# Patient Record
Sex: Female | Born: 1945
Health system: Southern US, Community
[De-identification: ages and names within clinical notes are randomized; demographics above are authoritative.]

## PROBLEM LIST (undated history)

## (undated) DIAGNOSIS — R350 Frequency of micturition: Secondary | ICD-10-CM

## (undated) DIAGNOSIS — R351 Nocturia: Secondary | ICD-10-CM

## (undated) DIAGNOSIS — N8110 Cystocele, unspecified: Secondary | ICD-10-CM

## (undated) DIAGNOSIS — I10 Essential (primary) hypertension: Secondary | ICD-10-CM

## (undated) DIAGNOSIS — R9431 Abnormal electrocardiogram [ECG] [EKG]: Secondary | ICD-10-CM

## (undated) DIAGNOSIS — Z87440 Personal history of urinary (tract) infections: Secondary | ICD-10-CM

## (undated) DIAGNOSIS — Z87898 Personal history of other specified conditions: Secondary | ICD-10-CM

## (undated) DIAGNOSIS — R7303 Prediabetes: Secondary | ICD-10-CM

## (undated) HISTORY — PX: TUBAL LIGATION: SHX77

## (undated) HISTORY — PX: ABDOMINAL HYSTERECTOMY: SHX81

---

## 2000-03-24 ENCOUNTER — Other Ambulatory Visit: Admission: RE | Admit: 2000-03-24 | Discharge: 2000-03-24 | Payer: Self-pay | Admitting: Family Medicine

## 2000-06-11 ENCOUNTER — Ambulatory Visit (HOSPITAL_COMMUNITY): Admission: RE | Admit: 2000-06-11 | Discharge: 2000-06-11 | Payer: Self-pay | Admitting: Gastroenterology

## 2000-06-11 ENCOUNTER — Encounter (INDEPENDENT_AMBULATORY_CARE_PROVIDER_SITE_OTHER): Payer: Self-pay | Admitting: Specialist

## 2001-05-23 ENCOUNTER — Encounter: Admission: RE | Admit: 2001-05-23 | Discharge: 2001-08-21 | Payer: Self-pay | Admitting: Family Medicine

## 2001-07-29 ENCOUNTER — Observation Stay (HOSPITAL_COMMUNITY): Admission: EM | Admit: 2001-07-29 | Discharge: 2001-07-30 | Payer: Self-pay | Admitting: Emergency Medicine

## 2001-07-29 ENCOUNTER — Encounter: Payer: Self-pay | Admitting: Emergency Medicine

## 2003-09-24 ENCOUNTER — Ambulatory Visit (HOSPITAL_COMMUNITY): Admission: RE | Admit: 2003-09-24 | Discharge: 2003-09-24 | Payer: Self-pay | Admitting: Family Medicine

## 2004-10-10 ENCOUNTER — Encounter (INDEPENDENT_AMBULATORY_CARE_PROVIDER_SITE_OTHER): Payer: Self-pay | Admitting: *Deleted

## 2004-10-10 ENCOUNTER — Ambulatory Visit (HOSPITAL_COMMUNITY): Admission: RE | Admit: 2004-10-10 | Discharge: 2004-10-10 | Payer: Self-pay | Admitting: Gastroenterology

## 2004-10-17 ENCOUNTER — Encounter: Admission: RE | Admit: 2004-10-17 | Discharge: 2004-10-17 | Payer: Self-pay | Admitting: Gastroenterology

## 2004-10-21 ENCOUNTER — Encounter: Admission: RE | Admit: 2004-10-21 | Discharge: 2004-10-21 | Payer: Self-pay | Admitting: Gastroenterology

## 2005-01-15 ENCOUNTER — Encounter: Admission: RE | Admit: 2005-01-15 | Discharge: 2005-01-15 | Payer: Self-pay | Admitting: Gastroenterology

## 2005-07-17 ENCOUNTER — Ambulatory Visit (HOSPITAL_COMMUNITY): Admission: RE | Admit: 2005-07-17 | Discharge: 2005-07-17 | Payer: Self-pay | Admitting: Cardiology

## 2005-07-17 ENCOUNTER — Encounter: Admission: RE | Admit: 2005-07-17 | Discharge: 2005-07-17 | Payer: Self-pay | Admitting: Cardiology

## 2006-09-01 ENCOUNTER — Encounter: Admission: RE | Admit: 2006-09-01 | Discharge: 2006-09-01 | Payer: Self-pay | Admitting: Family Medicine

## 2006-09-05 ENCOUNTER — Encounter: Admission: RE | Admit: 2006-09-05 | Discharge: 2006-09-05 | Payer: Self-pay | Admitting: Pediatrics

## 2006-09-20 ENCOUNTER — Ambulatory Visit (HOSPITAL_COMMUNITY): Admission: RE | Admit: 2006-09-20 | Discharge: 2006-09-20 | Payer: Self-pay | Admitting: Cardiology

## 2006-11-01 ENCOUNTER — Encounter: Admission: RE | Admit: 2006-11-01 | Discharge: 2006-11-01 | Payer: Self-pay | Admitting: Otolaryngology

## 2006-12-05 ENCOUNTER — Emergency Department (HOSPITAL_COMMUNITY): Admission: EM | Admit: 2006-12-05 | Discharge: 2006-12-05 | Payer: Self-pay | Admitting: Family Medicine

## 2007-12-13 ENCOUNTER — Encounter: Admission: RE | Admit: 2007-12-13 | Discharge: 2007-12-13 | Payer: Self-pay | Admitting: Family Medicine

## 2008-06-27 ENCOUNTER — Encounter: Admission: RE | Admit: 2008-06-27 | Discharge: 2008-06-27 | Payer: Self-pay | Admitting: Family Medicine

## 2009-07-02 ENCOUNTER — Emergency Department (HOSPITAL_COMMUNITY): Admission: EM | Admit: 2009-07-02 | Discharge: 2009-07-03 | Payer: Self-pay | Admitting: Emergency Medicine

## 2009-09-16 ENCOUNTER — Encounter: Admission: RE | Admit: 2009-09-16 | Discharge: 2009-10-01 | Payer: Self-pay | Admitting: Otolaryngology

## 2010-10-26 ENCOUNTER — Encounter: Payer: Self-pay | Admitting: Family Medicine

## 2010-10-26 ENCOUNTER — Encounter: Payer: Self-pay | Admitting: Otolaryngology

## 2011-02-20 NOTE — Op Note (Signed)
Rebekah Gibson, Rebekah NO.:  [id number]   MEDICAL RECORD NO.:  [id number]          PATIENT TYPE:  AMB   LOCATION:  ENDO                         FACILITY:  [facility]   PHYSICIAN:  Petra Kuba, M.D.    DATE OF BIRTH:  1946/07/05   DATE OF PROCEDURE:  10/10/2004  DATE OF DISCHARGE:                                 OPERATIVE REPORT   PROCEDURE:  Esophagogastroduodenoscopy.   INDICATIONS:  Upper tract symptoms, episodic dysphagia.  Consent was signed  after risks, benefits, methods, and options thoroughly discussed in the  office multiple times.   Additional medicines for this procedure:  Demerol 10 mg, Versed 1 mg.   PROCEDURE:  The video endoscope was inserted by direct vision.  The  esophagus was normal.  In the distal esophagus was a small hiatal hernia  with a widely patent, very thin ring.  The scope passed easily into the  stomach, advanced through a normal antrum, normal pylorus, into a normal  duodenal bulb and advanced around the C-loop to a normal second portion of  the duodenum.  The scope was withdrawn back to the bulb, and a good look  there ruled out ulcers in that location.  The scope was withdrawn back to  the stomach and retroflexed.  Angularis, cardia, fundus, lesser and greater  curve were normal on retroflexed visualization except for the hiatal hernia  being __________ in the cardia.  Straight visualization in the stomach did  not reveal any additional findings.  Air was suctioned and the scope slowly  withdrawn.  Again a good look at the esophagus was normal except for the  small hiatal hernia and ring as mentioned above.  The scope was removed.  The patient tolerated the procedure well.  There was no obvious immediate  complication.   ENDOSCOPIC DIAGNOSES:  1.  Small hiatal hernia with widely patent, thin ring.  2.  Otherwise normal EGD.   PLAN:  1.  Empiric dilatation p.r.n.  2.  Continue pump inhibitors.  3.  Await ultrasound.  4.   Pending those results, follow up p.r.n.  5.  Surgical consult or follow up with me in two months.       MEM/MEDQ  D:  10/10/2004  T:  10/10/2004  Job:  [id number]   cc:   Burnell Blanks, M.D.

## 2011-02-20 NOTE — Op Note (Signed)
Rebekah Gibson, Rebekah Gibson NO.:  [id number]   MEDICAL RECORD NO.:  [id number]          PATIENT TYPE:  AMB   LOCATION:  ENDO                         FACILITY:  [facility]   PHYSICIAN:  Petra Kuba, M.D.    DATE OF BIRTH:  March 15, 1946   DATE OF PROCEDURE:  10/10/2004  DATE OF DISCHARGE:                                 OPERATIVE REPORT   PROCEDURE:  Colonoscopy with polypectomy.   INDICATIONS:  History of colon polyps, due for repeat screening.  Consent  was signed after risks, benefits, methods, and options thoroughly discussed  in the office.   MEDICINES USED:  Demerol 70 mg, Versed 7 mg.   PROCEDURE:  Rectal inspection was pertinent for external hemorrhoids, small.  Digital exam was negative.  The video pediatric adjustable colonoscope was  inserted.  On insertion, left-sided diverticula were seen.  Also in the  midtransverse, a small polyp was seen and was snared on insertion.  The  polyp was suctioned through the scope and collected in the trap.  To advance  to the cecum was more difficult than in the past, possibly due to the  pediatric adjustable scope, but with abdominal pressure and rolling her on  her back, we were able to advance to the cecum.  The right colon seemed to  be extra tortuous.  Despite readvancing and withdrawing, we had difficulty  getting a good look at the right colon.  The cecum was identified by the  appendiceal orifice and the ileocecal valve.  There was a small polyp in the  cecum, which was snared on a cautery setting of 150/15.  This polyp was  suctioned through the scope and collected in the trap and put in a separate  container.  The scope was slowly withdrawn.  We did spend a good bit of time  trying to better evaluate the right side but due to looping, tortuosity, and  even rolling her on her right side and then back to her left side to try to  get better visualization, we were unsuccessful.  We just had trouble keeping  the scope in  the right side of the colon.  It seemed to fall back quickly  into the transverse.  We elected to withdraw.  The prep was adequate.  There  was some liquid stool that required washing and suctioning.  On slow  withdrawal back to the midtransverse, the polypectomy site was seen and had  a nice white coagulum.  No obvious residual polypoid tissue was seen, and  the scope was slowly withdrawn.  Other than the left-sided diverticula, no  additional findings were seen as we withdrew back to the rectum.  Anorectal  pull-through and retroflexion confirmed some small hemorrhoids.  The scope  was straightened and readvanced a short way up the left side of the colon,  air was suctioned, the scope removed.  The patient tolerated the procedure  well.  There was no obvious immediate complication.   ENDOSCOPIC DIAGNOSES:  1.  Internal-external hemorrhoids.  2.  Left-sided diverticula.  3.  Increased looping.  4.  Two small polyps, one snared in the  transverse, one in the cecum.  5.  Otherwise within normal limits to the cecum, right side not well-seen      due to tortuosity and looping.   PLAN:  Await pathology, but probably recheck colon screening in three years  using the regular scope, since we did not get a great look at the right  side.  Otherwise continue workup with a one-time EGD.       MEM/MEDQ  D:  10/10/2004  T:  10/10/2004  Job:  [id number]   cc:   Burnell Blanks, M.D.

## 2011-02-20 NOTE — Op Note (Signed)
[place]. [facility]  Patient:    Rebekah Gibson, Rebekah Gibson                 MRN: [id number] Proc. Date: 06/11/00 Adm. Date:  [id number] Disc. Date: [id number] Attending:  Nelda Marseille CC:         Burnell Blanks, M.D.   Operative Report  PROCEDURE:  Colonoscopy with polypectomy.  ENDOSCOPIST:  Petra Kuba, M.D.  INDICATIONS:  Screening, gas, bloat.  INFORMED CONSENT:  Consent was signed after risk, benefits, methods and options were thoroughly discussed in the office.  MEDICINES USED:  Demerol 75 mg, Versed 7 mg.  DESCRIPTION OF PROCEDURE:  Rectal inspection is pertinent for external hemorrhoids.  Digital exam was negative.  Video colonoscope was inserted fairly easily and advanced around the colon to the cecum.  This did require abdominal pressure rolling her on her back.  The cecum was identified by the appendiceal orifice and the ileocecal valve.  On insertion some left-sided diverticula as well as small polyp in the proximal level of the hepatic flexure.  No other abnormalities were seen on insertion.  The scope was slowly withdrawn.  The prep was adequate.  There was some liquid stool that required washing and suctioning.  Cecum and ascending were normal.  Scope was withdrawn back to the small polyp in the transverse which was snared, electrocautery applied and the polyp was suctioned through the scope and collected in the trap.  The scope was further withdrawn and in the mid transverse another tiny polyp was seen and was hot biopsied x 1.  Scope was further withdrawn to the proximal level of the splenic flexure where another 2-3 mm polyp was seen, snared, unfortunately we cut through the polyp and went ahead hot biopsied at the base.  The residual tissue was suctioned through the scope and collected in the trap.  The scope was further withdrawn.  Some rare occasional left-sided diverticula were seen.  Two tiny sigmoid polyps were seen and  were each hot biopsied x 1.  No other abnormalities were seen as we slowly withdrew back to the rectum.  Once back in the rectum the scope was retroflexed pertinent for some internal hemorrhoids.  Scope was straightened and readvanced a short ways up the sigmoid.  Air was suctioned, scope removed. The patient tolerated the procedure well.  There was no obvious immediate complication.  ENDOSCOPIC DIAGNOSIS: 1. Internal/external hemorrhoids. 2. Rare early left-sided diverticula. 3. ______ small polyps - 2 snared and 3 hot biopsied with one in the snares    requiring hot biopsy of the base. 4. Otherwise within normal limits to the cecum. DD:  06/11/00 TD:  06/13/00 Job: [id number]

## 2011-02-20 NOTE — H&P (Signed)
[place]. [facility]  Patient:    Rebekah Gibson, Rebekah Gibson Visit Number: [id number] MRN: [id number]          Service Type: MED Location: [phone] Attending Physician:  McDiarmid, Leighton Roach. Dictated by:   Solon Palm, M.D. Admit Date:  07/29/2001 Discharge Date: 07/30/2001   CC:         Burnell Blanks, M.D.   History and Physical  SERVICE:  Conservation officer, historic buildings.  ATTENDING:  Huey Bienenstock McDiarmid, M.D.  PRIMARY:  Burnell Blanks, M.D. at [facility].  CHIEF COMPLAINT:  Chest pain.  HISTORY OF PRESENT ILLNESS:  Patient is a 65 year old white female with history of hyperlipidemia and glucose intolerance who works in the ED and noticed an increased intensity and duration of chest discomfort that she has noticed intermittently over this past week.  She describes the discomfort as a dull ache at her left sternal border at the third and fourth intercostal sternal junction, onset about 10 a.m., now improved with rest.  Patient refused a nitroglycerin offered.  Denies associated, nausea, or diaphoresis. It radiates to the left lateral chest wall area at the neck or arm.  It is unaffected by her movement or by position.  PAST MEDICAL HISTORY: 1. Hypercholesterolemia, hypertriglyceridemia - attempting control with diet    measures and garlic. 2. Glucose resistance - blood sugar diet controlled. 3. History of chest pain, admitted to [facility] in 1989 when she had a    catheterization, told it was normal without blockage. 4. Hysterectomy in 1995 secondary to uterine fibroids.  FAMILY HISTORY:  Mother died at 52 years secondary to cardiac disease and she had a CABG in her 61s.  Her first MI was in her 73s.  Father died in 60s of cancer.  Healthy children and siblings.  No diabetes, cancer, hypertension.  MEDICATIONS:  None regularly.  Does take garlic, multivitamins, beta complex, lecithin sometimes, as well as over-the-counter  ibuprofen.  ALLERGIES:  CODEINE causes her to itch.  ERYTHROMYCIN causes GI upset.  She did have hypotension after being given NITROGLYCERIN at [facility].  SOCIAL HISTORY:  She is divorced after a 23-year marriage.  She has been remarried for two years in a good relationship.  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  No tobacco or alcohol, no drugs.  REVIEW OF SYSTEMS:  GENERAL:  No recent URI illness, weight change, fever, or chills.  ENT:  No allergies.  EYES:  Occasional blurred vision with increased blood sugar.  CV:  As above.  No orthopnea.  No paroxysmal nocturnal dyspnea. No exertional chest pain.  PULMONARY:  No dyspnea, cough, wheeze.  GI:  Over past years, had six to seven episodes of food "getting stuck;" having to relax for her improved discomfort.  No recent episodes, no dyspepsia, no reflux, no pain, no melena, no hematochezia.  Normal bowel movements daily. MUSCULOSKELETAL:  Right neck strain from holding telephone at work.  Bilateral knee pain, worse with weather, relieved by ibuprofen.  PHYSICAL EXAMINATION:  VITAL SIGNS:  Blood pressure 157/94, heart rate 73, respirations 18, O2 saturation 100% 2 liters O2, temperature 98.  GENERAL:  Calm, no acute distress, and pleasant.  HEENT:  Normocephalic, atraumatic.  PERRLA, EOMI.  Nares patent.  Oropharynx normal.  NECK:  No thyromegaly, no lymphadenopathy.  CHEST:  Good air movement.  No wheezes, rales, or rhonchi.  CARDIOVASCULAR:  No JVD, no bruit.  Regular rate and rhythm.  A 2/6 systolic ejection  murmur greater left sternal border.  No chest wall tenderness.  ABDOMEN:  Soft, nontender, nondistended.  No hepatosplenomegaly.  Normoactive bowel sounds.  EXTREMITIES:  No clubbing, cyanosis, or edema.  No joint deformity.  LABORATORY DATA:  Chest x-ray pending.  EKG shows right bundle-branch block, normal sinus rhythm, no change since 1995.  WBC 6.9, hemoglobin 13.9, hematocrit 41.2, platelets 281.  Sodium  on i-STAT is 140, potassium 3.6, chloride 105, CO 26, BUN 16, creatinine 0.6, glucose 139. CK 178, MB 4.4, relative index 2.5, troponin 0.01.  PT 12, PTT 28, INR 0.9.  ASSESSMENT AND PLAN:  A 65 year old white female with chest pain.  1. Atypical chest pain with risk factors including family history,    hypercholesterolemia.  Will follow enzymes and EKG.  If positive,    initiate unstable angina protocol. 2. Controlled diabetes mellitus versus glucose intolerance.  Will check CBGs.    Will keep on ADA diet.  Hemoglobin A1c 6.5 per patient. 3. Anticipate discharge in a.m. 4. Elevated blood pressures here in the emergency department without history    of hypertension.  Will follow. Dictated by:   Solon Palm, M.D. Attending Physician:  McDiarmid, Tawanna Cooler D. DD:  07/29/01 TD:  07/30/01 Job: [id number]

## 2012-12-11 ENCOUNTER — Emergency Department (HOSPITAL_COMMUNITY): Payer: Medicare Other

## 2012-12-11 ENCOUNTER — Encounter (HOSPITAL_COMMUNITY): Payer: Self-pay | Admitting: Emergency Medicine

## 2012-12-11 ENCOUNTER — Emergency Department (HOSPITAL_COMMUNITY)
Admission: EM | Admit: 2012-12-11 | Discharge: 2012-12-11 | Disposition: A | Discharge status: Home / Self Care | Payer: Medicare Other | Attending: Emergency Medicine | Admitting: Emergency Medicine

## 2012-12-11 DIAGNOSIS — Z90710 Acquired absence of both cervix and uterus: Secondary | ICD-10-CM | POA: Insufficient documentation

## 2012-12-11 DIAGNOSIS — R109 Unspecified abdominal pain: Secondary | ICD-10-CM

## 2012-12-11 DIAGNOSIS — N1330 Unspecified hydronephrosis: Secondary | ICD-10-CM | POA: Insufficient documentation

## 2012-12-11 DIAGNOSIS — N8111 Cystocele, midline: Secondary | ICD-10-CM | POA: Insufficient documentation

## 2012-12-11 DIAGNOSIS — R509 Fever, unspecified: Secondary | ICD-10-CM | POA: Insufficient documentation

## 2012-12-11 DIAGNOSIS — Z79899 Other long term (current) drug therapy: Secondary | ICD-10-CM | POA: Insufficient documentation

## 2012-12-11 DIAGNOSIS — N8110 Cystocele, unspecified: Secondary | ICD-10-CM

## 2012-12-11 HISTORY — DX: Cystocele, unspecified: N81.10

## 2012-12-11 LAB — URINE MICROSCOPIC-ADD ON

## 2012-12-11 LAB — COMPREHENSIVE METABOLIC PANEL
ALT: 17 U/L (ref 0–35)
BUN: 13 mg/dL (ref 6–23)
CO2: 25 mEq/L (ref 19–32)
Calcium: 9.1 mg/dL (ref 8.4–10.5)
Creatinine, Ser: 0.66 mg/dL (ref 0.50–1.10)
GFR calc Af Amer: >90 mL/min (ref >90)
GFR calc non Af Amer: >90 mL/min (ref >90)
Glucose, Bld: 117 mg/dL — ABNORMAL HIGH (ref 70–99)
Total Protein: 7.3 g/dL (ref 6.0–8.3)

## 2012-12-11 LAB — URINALYSIS, ROUTINE W REFLEX MICROSCOPIC
Bilirubin Urine: NEGATIVE
Glucose, UA: NEGATIVE mg/dL
Specific Gravity, Urine: 1.013 (ref 1.005–1.030)
Urobilinogen, UA: 0.2 mg/dL (ref 0.0–1.0)

## 2012-12-11 LAB — CBC WITH DIFFERENTIAL/PLATELET
Eosinophils Absolute: 0 10*3/uL (ref 0.0–0.7)
Eosinophils Relative: 0 % (ref 0–5)
HCT: 34.6 % — ABNORMAL LOW (ref 36.0–46.0)
Lymphocytes Relative: 14 % (ref 12–46)
Lymphs Abs: 1.4 10*3/uL (ref 0.7–4.0)
MCH: 29 pg (ref 26.0–34.0)
MCV: 83 fL (ref 78.0–100.0)
Monocytes Absolute: 0.9 10*3/uL (ref 0.1–1.0)
Monocytes Relative: 9 % (ref 3–12)
Platelets: 203 10*3/uL (ref 150–400)
RBC: 4.17 MIL/uL (ref 3.87–5.11)
WBC: 9.6 10*3/uL (ref 4.0–10.5)

## 2012-12-11 LAB — LACTIC ACID, PLASMA: Lactic Acid, Venous: 1 mmol/L (ref 0.5–2.2)

## 2012-12-11 MED ORDER — SODIUM CHLORIDE 0.9 % IV SOLN: Freq: Once | INTRAVENOUS | Status: DC

## 2012-12-11 MED ORDER — ACETAMINOPHEN 325 MG PO TABS
650.0000 mg | ORAL_TABLET | Freq: Once | ORAL | Status: AC
  Administered 2012-12-11: 650 mg via ORAL
  Filled 2012-12-11: qty 2

## 2012-12-11 NOTE — ED Provider Notes (Signed)
History     CSN: [id number]  Arrival date & time 12/11/12  1237   First MD Initiated Contact with Patient 12/11/12 1335      Chief Complaint  Patient presents with  . Pelvic Pain    (Consider location/radiation/quality/duration/timing/severity/associated sxs/prior treatment) HPI Comments: Patient with hx of bladder prolapse presents with fever to 101.5 and a waxing and waning ache in her suprapubic region and b/l flank. Patient followed by Dr. Jacquelyne Balint of urology who plans to perform surgery for a bladder sling; patient states UA done at w/u 2 weeks ago reveal gross blood in her urine. Patient put on cipro and states her hematuria resolved. Has been off cipro x 1 week. Patient noticed onset of b/l flank "ache" 2 days ago along with increased temperature to Tmax yesterday. Called urologist who told her to come to ED. Denies aggravating or alleviating factors of her suprapubic and b/l flank "ache". Patient denies headache, CP, SOB, N/V/D, dysuria, hematuria, and numbness/tingling in her lower extremities.  Patient is a 67 y.o. female presenting with pelvic pain. The history is provided by the patient. No language interpreter was used.  Pelvic Pain Associated symptoms include a fever. Pertinent negatives include no chest pain, headaches, nausea, numbness or vomiting.    Past Medical History  Diagnosis Date  . Female bladder prolapse     Past Surgical History  Procedure Laterality Date  . Abdominal hysterectomy      History reviewed. No pertinent family history.  History  Substance Use Topics  . Smoking status: Never Smoker   . Smokeless tobacco: Never Used  . Alcohol Use: No    OB History   Grav Para Term Preterm Abortions TAB SAB Ect Mult Living                  Review of Systems  Constitutional: Positive for fever.  Respiratory: Negative for chest tightness and shortness of breath.   Cardiovascular: Negative for chest pain.  Gastrointestinal: Negative for nausea,  vomiting, diarrhea and blood in stool.  Genitourinary: Positive for pelvic pain. Negative for dysuria, hematuria, decreased urine volume and difficulty urinating.  Skin: Negative for color change.  Neurological: Negative for syncope, numbness and headaches.  All other systems reviewed and are negative.    Allergies  Codeine; Erythromycin; and Nitroglycerin  Home Medications   Current Outpatient Rx  Name  Route  Sig  Dispense  Refill  . Naproxen Sodium (ALEVE) 220 MG CAPS   Oral   Take 440 mg by mouth daily.         Marland Kitchen olmesartan-hydrochlorothiazide (BENICAR HCT) 40-25 MG per tablet   Oral   Take 0.5 tablets by mouth daily.           BP 165/93  Pulse 88  Temp(Src) 98.7 F (37.1 C) (Oral)  Resp 16  SpO2 96%  Physical Exam  Nursing note and vitals reviewed. Constitutional: She is oriented to person, place, and time. She appears well-developed and well-nourished. No distress.  HENT:  Head: Normocephalic and atraumatic.  Mouth/Throat: Oropharynx is clear and moist. No oropharyngeal exudate.  Eyes: Conjunctivae are normal. Pupils are equal, round, and reactive to light. No scleral icterus.  Neck: Normal range of motion. Neck supple.  Cardiovascular: Normal rate, normal heart sounds and intact distal pulses.   Pulmonary/Chest: Effort normal and breath sounds normal. No respiratory distress. She has no wheezes. She has no rales.  Abdominal: Soft. Bowel sounds are normal. She exhibits no distension and no mass. There  is tenderness in the suprapubic area. There is CVA tenderness. There is no rebound, no guarding, no tenderness at McBurney's point and negative Murphy's sign.  No peritoneal signs. Tenderness only elicited on very deep palpation  Musculoskeletal: Normal range of motion. She exhibits no edema.  Lymphadenopathy:    She has no cervical adenopathy.  Neurological: She is alert and oriented to person, place, and time.  Skin: Skin is warm and dry. No rash noted. She is  not diaphoretic. No erythema.  Psychiatric: She has a normal mood and affect. Her behavior is normal.    ED Course  Procedures (including critical care time)  Labs Reviewed  URINALYSIS, ROUTINE W REFLEX MICROSCOPIC - Abnormal; Notable for the following:    APPearance CLOUDY (*)    Hgb urine dipstick SMALL (*)    Leukocytes, UA SMALL (*)    All other components within normal limits  CBC WITH DIFFERENTIAL - Abnormal; Notable for the following:    HCT 34.6 (*)    All other components within normal limits  COMPREHENSIVE METABOLIC PANEL - Abnormal; Notable for the following:    Sodium 132 (*)    Glucose, Bld 117 (*)    All other components within normal limits  CULTURE, BLOOD (ROUTINE X 2)  CULTURE, BLOOD (ROUTINE X 2)  URINE CULTURE  LACTIC ACID, PLASMA  URINE MICROSCOPIC-ADD ON   Dg Chest 2 View  12/11/2012  *RADIOLOGY REPORT*  Clinical Data: Fever, pelvic pain  CHEST - 2 VIEW  Comparison: 12/05/2006  Findings: Lungs are clear. No pleural effusion or pneumothorax.  The heart is top normal size.  Mild degenerative changes of the visualized thoracolumbar spine.  IMPRESSION: No evidence of acute cardiopulmonary disease.   Original Report Authenticated By: Charline Bills, M.D.      1. Pain, flank, bilateral   2. Female bladder prolapse   3. Hydronephrosis       MDM  Patient with hx of bladder prolapse presents for fever and b/l flank and suprapubic "ache" x 2 days. Patient finished course of cipro 1 week ago prescribed to her by her urologist for hematuria. Patient appears to be in some discomfort in ED today, but is overall well and nontoxic appearing. W/u to include CXR, CBC, CMP, blood cultures, lactic acid and UA. Patient given tylenol for low grade temperature, but states she does not need anything for pain.  Work up without leukocytosis, elevated lactic acid, and bacteruria. CXR without acute cardiopulmonary changes. Temperature and blood pressure have improved since ED  arrival and patient continues to be well appearing and in NAD. Patient examined by Dr. Patria Mane who believes patient is stable for d/c with urology follow up. Patient given indications for ED return. Patient work up and management discussed with Dr. Patria Mane who is in agreement.  Filed Vitals:   12/11/12 1315 12/11/12 1411 12/11/12 1420 12/11/12 1643  BP: 202/102 161/77 154/75 165/93  Pulse: 100   88  Temp: 99.5 F (37.5 C)   98.7 F (37.1 C)  TempSrc: Oral   Oral  Resp: 16   16  SpO2: 100%   96%           Antony Madura, PA-C 12/12/12 [phone]

## 2012-12-11 NOTE — ED Notes (Signed)
Pt states that her bladder has prolapsed and she has been seeing a urologist who wants to do surgery but she has had a bladder infection so she could not have surgery yet.  States she took cipro, was off of it for a week, and then started having pelvic pain and fever.

## 2012-12-11 NOTE — ED Notes (Signed)
Pt unable to urinate at this time.  

## 2012-12-11 NOTE — ED Notes (Signed)
Patient transported to X-ray 

## 2012-12-11 NOTE — ED Notes (Signed)
Second set of blood cultures drawn and sent to lab.

## 2012-12-11 NOTE — ED Notes (Signed)
Attempted IV insertion - pt requested not to have IV placed. Phlebotomy notified, will draw second set of blood cultures

## 2012-12-12 NOTE — ED Provider Notes (Signed)
Medical screening examination/treatment/procedure(s) were conducted as a shared visit with non-physician practitioner(s) and myself.  I personally evaluated the patient during the encounter  Well appearing. Nontoxic. Suspect viral cause of fever. Unrelated to prolapsed bladder. Close urology and pcp follow up  Lyanne Co, MD 12/12/12 1550

## 2012-12-14 LAB — URINE CULTURE: Colony Count: >=100000

## 2012-12-17 LAB — CULTURE, BLOOD (ROUTINE X 2): Culture: NO GROWTH

## 2012-12-18 ENCOUNTER — Telehealth (HOSPITAL_COMMUNITY): Payer: Self-pay | Admitting: Emergency Medicine

## 2012-12-18 NOTE — ED Notes (Signed)
Chart returned from EDP office. Per Marlon Pel PA-C, Ampicillin 500 mg tab. 500 mg PO QID for 7 days. Disp #28.

## 2013-01-02 ENCOUNTER — Other Ambulatory Visit: Payer: Self-pay | Admitting: Urology

## 2013-02-28 ENCOUNTER — Encounter (HOSPITAL_COMMUNITY): Payer: Self-pay | Admitting: Pharmacy Technician

## 2013-03-07 ENCOUNTER — Encounter (HOSPITAL_COMMUNITY)
Admission: RE | Admit: 2013-03-07 | Discharge: 2013-03-07 | Disposition: A | Discharge status: Home / Self Care | Payer: Medicare Other | Source: Ambulatory Visit | Attending: Urology | Admitting: Urology

## 2013-03-07 ENCOUNTER — Encounter (HOSPITAL_COMMUNITY): Payer: Self-pay

## 2013-03-07 DIAGNOSIS — Z01810 Encounter for preprocedural cardiovascular examination: Secondary | ICD-10-CM | POA: Insufficient documentation

## 2013-03-07 DIAGNOSIS — R9431 Abnormal electrocardiogram [ECG] [EKG]: Secondary | ICD-10-CM | POA: Insufficient documentation

## 2013-03-07 DIAGNOSIS — N816 Rectocele: Secondary | ICD-10-CM | POA: Insufficient documentation

## 2013-03-07 DIAGNOSIS — N393 Stress incontinence (female) (male): Secondary | ICD-10-CM | POA: Insufficient documentation

## 2013-03-07 DIAGNOSIS — Z01812 Encounter for preprocedural laboratory examination: Secondary | ICD-10-CM | POA: Insufficient documentation

## 2013-03-07 DIAGNOSIS — N8111 Cystocele, midline: Secondary | ICD-10-CM | POA: Insufficient documentation

## 2013-03-07 DIAGNOSIS — Z0183 Encounter for blood typing: Secondary | ICD-10-CM | POA: Insufficient documentation

## 2013-03-07 HISTORY — DX: Frequency of micturition: R35.0

## 2013-03-07 HISTORY — DX: Personal history of other specified conditions: Z87.898

## 2013-03-07 HISTORY — DX: Nocturia: R35.1

## 2013-03-07 HISTORY — DX: Abnormal electrocardiogram (ECG) (EKG): R94.31

## 2013-03-07 HISTORY — DX: Prediabetes: R73.03

## 2013-03-07 HISTORY — DX: Personal history of urinary (tract) infections: Z87.440

## 2013-03-07 HISTORY — DX: Essential (primary) hypertension: I10

## 2013-03-07 LAB — BASIC METABOLIC PANEL
BUN: 18 mg/dL (ref 6–23)
CO2: 28 mEq/L (ref 19–32)
Chloride: 99 mEq/L (ref 96–112)
GFR calc Af Amer: 87 mL/min — ABNORMAL LOW (ref >90)
Potassium: 4 mEq/L (ref 3.5–5.1)

## 2013-03-07 LAB — CBC
HCT: 35.7 % — ABNORMAL LOW (ref 36.0–46.0)
Hemoglobin: 12.2 g/dL (ref 12.0–15.0)
RBC: 4.31 MIL/uL (ref 3.87–5.11)

## 2013-03-07 LAB — PROTIME-INR
INR: 0.91 (ref 0.00–1.49)
Prothrombin Time: 12.2 seconds (ref 11.6–15.2)

## 2013-03-07 LAB — APTT: aPTT: 31 seconds (ref 24–37)

## 2013-03-07 LAB — SURGICAL PCR SCREEN: MRSA, PCR: NEGATIVE

## 2013-03-07 NOTE — Patient Instructions (Signed)
Rebekah Gibson  03/07/2013                           YOUR PROCEDURE IS SCHEDULED ON: 03/14/13               PLEASE REPORT TO SHORT STAY CENTER AT : 5:15 AM               CALL THIS NUMBER IF ANY PROBLEMS THE DAY OF SURGERY :               [phone]                      REMEMBER:   Do not eat food or drink liquids AFTER MIDNIGHT    Take these medicines the morning of surgery with A SIP OF WATER:NONE   Do not wear jewelry, make-up   Do not wear lotions, powders, or perfumes.   Do not shave legs or underarms 12 hrs. before surgery (men may shave face)  Do not bring valuables to the hospital.  Contacts, dentures or bridgework may not be worn into surgery.  Leave suitcase in the car. After surgery it may be brought to your room.  For patients admitted to the hospital more than one night, checkout time is 11:00                          The day of discharge.   Patients discharged the day of surgery will not be allowed to drive home                             If going home same day of surgery, must have someone stay with you first                           24 hrs at home and arrange for some one to drive you home from hospital.    Special Instructions:   Please read over the following fact sheets that you were given:               1. MRSA  INFORMATION                      2. [name] PREPARING FOR SURGERY SHEET               3. FOLLOW BOWEL PREP INSTRUCTIONS                                                X_____________________________________________________________________        Failure to follow these instructions may result in cancellation of your surgery

## 2013-03-13 MED ORDER — GENTAMICIN SULFATE 40 MG/ML IJ SOLN
280.0000 mg | INTRAVENOUS | Status: AC
  Administered 2013-03-14: 280 mg via INTRAVENOUS
  Filled 2013-03-13: qty 7

## 2013-03-14 ENCOUNTER — Ambulatory Visit (HOSPITAL_COMMUNITY): Payer: Medicare Other | Admitting: Registered Nurse

## 2013-03-14 ENCOUNTER — Encounter (HOSPITAL_COMMUNITY): Payer: Self-pay | Admitting: *Deleted

## 2013-03-14 ENCOUNTER — Observation Stay (HOSPITAL_COMMUNITY)
Admission: RE | Admit: 2013-03-14 | Discharge: 2013-03-15 | Disposition: A | Discharge status: Home / Self Care | Payer: Medicare Other | Source: Ambulatory Visit | Attending: Urology | Admitting: Urology

## 2013-03-14 ENCOUNTER — Encounter (HOSPITAL_COMMUNITY): Payer: Self-pay | Admitting: Registered Nurse

## 2013-03-14 ENCOUNTER — Encounter (HOSPITAL_COMMUNITY)
Admission: RE | Disposition: A | Discharge status: Home / Self Care | Payer: Self-pay | Source: Ambulatory Visit | Attending: Urology

## 2013-03-14 DIAGNOSIS — N8111 Cystocele, midline: Secondary | ICD-10-CM | POA: Insufficient documentation

## 2013-03-14 DIAGNOSIS — N816 Rectocele: Secondary | ICD-10-CM | POA: Insufficient documentation

## 2013-03-14 DIAGNOSIS — N3946 Mixed incontinence: Secondary | ICD-10-CM | POA: Insufficient documentation

## 2013-03-14 DIAGNOSIS — I10 Essential (primary) hypertension: Secondary | ICD-10-CM | POA: Insufficient documentation

## 2013-03-14 DIAGNOSIS — E7800 Pure hypercholesterolemia, unspecified: Secondary | ICD-10-CM | POA: Insufficient documentation

## 2013-03-14 DIAGNOSIS — N1330 Unspecified hydronephrosis: Secondary | ICD-10-CM | POA: Insufficient documentation

## 2013-03-14 DIAGNOSIS — N993 Prolapse of vaginal vault after hysterectomy: Principal | ICD-10-CM | POA: Insufficient documentation

## 2013-03-14 HISTORY — PX: CYSTOSCOPY: SHX5120

## 2013-03-14 HISTORY — PX: ANTERIOR AND POSTERIOR REPAIR: SHX5121

## 2013-03-14 HISTORY — PX: PUBOVAGINAL SLING: SHX1035

## 2013-03-14 HISTORY — PX: BLADDER SUSPENSION: SHX72

## 2013-03-14 LAB — TYPE AND SCREEN: Antibody Screen: NEGATIVE

## 2013-03-14 LAB — HEMOGLOBIN AND HEMATOCRIT, BLOOD: Hemoglobin: 11 g/dL — ABNORMAL LOW (ref 12.0–15.0)

## 2013-03-14 SURGERY — CYSTOSCOPY
Anesthesia: General | Site: Vagina | Wound class: Clean Contaminated

## 2013-03-14 MED ORDER — NEOSTIGMINE METHYLSULFATE 1 MG/ML IJ SOLN
INTRAMUSCULAR | Status: DC | PRN
  Administered 2013-03-14: 3.5 mg via INTRAVENOUS

## 2013-03-14 MED ORDER — FENTANYL CITRATE 0.05 MG/ML IJ SOLN
25.0000 ug | INTRAMUSCULAR | Status: DC | PRN
  Administered 2013-03-14 (×2): 25 ug via INTRAVENOUS

## 2013-03-14 MED ORDER — LACTATED RINGERS IV SOLN: INTRAVENOUS | Status: DC

## 2013-03-14 MED ORDER — ESTRADIOL 0.1 MG/GM VA CREA
TOPICAL_CREAM | VAGINAL | Status: AC
  Filled 2013-03-14: qty 85

## 2013-03-14 MED ORDER — IRBESARTAN 150 MG PO TABS
150.0000 mg | ORAL_TABLET | Freq: Every day | ORAL | Status: DC
  Administered 2013-03-14 – 2013-03-15 (×2): 150 mg via ORAL
  Filled 2013-03-14 (×2): qty 1

## 2013-03-14 MED ORDER — SODIUM CHLORIDE 0.9 % IV SOLN
INTRAVENOUS | Status: AC
  Filled 2013-03-14: qty 1.5

## 2013-03-14 MED ORDER — PROPOFOL 10 MG/ML IV BOLUS
INTRAVENOUS | Status: DC | PRN
  Administered 2013-03-14: 160 mg via INTRAVENOUS

## 2013-03-14 MED ORDER — SODIUM CHLORIDE 0.9 % IV SOLN
1.5000 g | INTRAVENOUS | Status: AC
  Administered 2013-03-14: 1.5 g via INTRAVENOUS

## 2013-03-14 MED ORDER — LIDOCAINE HCL (CARDIAC) 20 MG/ML IV SOLN
INTRAVENOUS | Status: DC | PRN
  Administered 2013-03-14: 80 mg via INTRAVENOUS

## 2013-03-14 MED ORDER — STERILE WATER FOR IRRIGATION IR SOLN
Status: DC | PRN
  Administered 2013-03-14: 3000 mL

## 2013-03-14 MED ORDER — INDIGOTINDISULFONATE SODIUM 8 MG/ML IJ SOLN
INTRAMUSCULAR | Status: DC | PRN
  Administered 2013-03-14 (×3): 5 mL via INTRAVENOUS

## 2013-03-14 MED ORDER — FENTANYL CITRATE 0.05 MG/ML IJ SOLN
INTRAMUSCULAR | Status: AC
  Filled 2013-03-14: qty 2

## 2013-03-14 MED ORDER — LIDOCAINE-EPINEPHRINE (PF) 1 %-1:200000 IJ SOLN
INTRAMUSCULAR | Status: AC
  Filled 2013-03-14: qty 10

## 2013-03-14 MED ORDER — ONDANSETRON HCL 4 MG/2ML IJ SOLN
INTRAMUSCULAR | Status: DC | PRN
  Administered 2013-03-14: 4 mg via INTRAVENOUS

## 2013-03-14 MED ORDER — SODIUM CHLORIDE 0.9 % IR SOLN
Status: DC | PRN
  Administered 2013-03-14: 08:00:00

## 2013-03-14 MED ORDER — MEPERIDINE HCL 50 MG/ML IJ SOLN: 6.2500 mg | INTRAMUSCULAR | Status: DC | PRN

## 2013-03-14 MED ORDER — PROMETHAZINE HCL 25 MG/ML IJ SOLN
INTRAMUSCULAR | Status: AC
  Filled 2013-03-14: qty 1

## 2013-03-14 MED ORDER — INDIGOTINDISULFONATE SODIUM 8 MG/ML IJ SOLN
INTRAMUSCULAR | Status: AC
  Filled 2013-03-14: qty 5

## 2013-03-14 MED ORDER — HYDROCHLOROTHIAZIDE 25 MG PO TABS
25.0000 mg | ORAL_TABLET | Freq: Every day | ORAL | Status: DC
  Administered 2013-03-14 – 2013-03-15 (×2): 25 mg via ORAL
  Filled 2013-03-14 (×2): qty 1

## 2013-03-14 MED ORDER — LACTATED RINGERS IV SOLN
INTRAVENOUS | Status: DC | PRN
  Administered 2013-03-14: 07:00:00 via INTRAVENOUS

## 2013-03-14 MED ORDER — FENTANYL CITRATE 0.05 MG/ML IJ SOLN
INTRAMUSCULAR | Status: DC | PRN
  Administered 2013-03-14 (×5): 50 ug via INTRAVENOUS

## 2013-03-14 MED ORDER — GLYCOPYRROLATE 0.2 MG/ML IJ SOLN
INTRAMUSCULAR | Status: DC | PRN
  Administered 2013-03-14: .5 mg via INTRAVENOUS

## 2013-03-14 MED ORDER — OLMESARTAN MEDOXOMIL-HCTZ 40-25 MG PO TABS: 0.5000 | ORAL_TABLET | Freq: Every day | ORAL | Status: DC | PRN

## 2013-03-14 MED ORDER — HYDROCODONE-ACETAMINOPHEN 5-325 MG PO TABS
1.0000 | ORAL_TABLET | ORAL | Status: DC | PRN
  Administered 2013-03-14 (×2): 1 via ORAL
  Filled 2013-03-14: qty 1
  Filled 2013-03-14: qty 2

## 2013-03-14 MED ORDER — MORPHINE SULFATE 2 MG/ML IJ SOLN
2.0000 mg | INTRAMUSCULAR | Status: DC | PRN
  Administered 2013-03-15: 2 mg via INTRAVENOUS
  Filled 2013-03-14: qty 1

## 2013-03-14 MED ORDER — ROCURONIUM BROMIDE 100 MG/10ML IV SOLN
INTRAVENOUS | Status: DC | PRN
  Administered 2013-03-14: 10 mg via INTRAVENOUS
  Administered 2013-03-14: 50 mg via INTRAVENOUS
  Administered 2013-03-14: 10 mg via INTRAVENOUS

## 2013-03-14 MED ORDER — ESTRADIOL 0.1 MG/GM VA CREA
TOPICAL_CREAM | VAGINAL | Status: DC | PRN
  Administered 2013-03-14: 1 via VAGINAL

## 2013-03-14 MED ORDER — DEXTROSE-NACL 5-0.45 % IV SOLN
INTRAVENOUS | Status: DC
  Administered 2013-03-14 – 2013-03-15 (×3): via INTRAVENOUS

## 2013-03-14 MED ORDER — MIDAZOLAM HCL 5 MG/5ML IJ SOLN
INTRAMUSCULAR | Status: DC | PRN
  Administered 2013-03-14: 2 mg via INTRAVENOUS

## 2013-03-14 MED ORDER — PROMETHAZINE HCL 25 MG/ML IJ SOLN
6.2500 mg | INTRAMUSCULAR | Status: DC | PRN
  Administered 2013-03-14: 6.25 mg via INTRAVENOUS

## 2013-03-14 MED ORDER — LIDOCAINE-EPINEPHRINE (PF) 1 %-1:200000 IJ SOLN
INTRAMUSCULAR | Status: DC | PRN
  Administered 2013-03-14: 45 mL

## 2013-03-14 MED ORDER — DIPHENHYDRAMINE HCL 12.5 MG/5ML PO ELIX: 12.5000 mg | ORAL_SOLUTION | Freq: Four times a day (QID) | ORAL | Status: DC | PRN

## 2013-03-14 MED ORDER — DIPHENHYDRAMINE HCL 50 MG/ML IJ SOLN: 12.5000 mg | Freq: Four times a day (QID) | INTRAMUSCULAR | Status: DC | PRN

## 2013-03-14 MED ORDER — EPHEDRINE SULFATE 50 MG/ML IJ SOLN
INTRAMUSCULAR | Status: DC | PRN
  Administered 2013-03-14 (×3): 5 mg via INTRAVENOUS

## 2013-03-14 MED ORDER — HYDROCODONE-ACETAMINOPHEN 5-325 MG PO TABS: 1.0000 | ORAL_TABLET | Freq: Four times a day (QID) | ORAL | Status: DC | PRN

## 2013-03-14 MED ORDER — ACETAMINOPHEN 10 MG/ML IV SOLN
1000.0000 mg | Freq: Four times a day (QID) | INTRAVENOUS | Status: AC
Start: 2013-03-14 — End: 2013-03-15
  Administered 2013-03-14 – 2013-03-15 (×4): 1000 mg via INTRAVENOUS
  Filled 2013-03-14 (×4): qty 100

## 2013-03-14 MED ORDER — INDIGOTINDISULFONATE SODIUM 8 MG/ML IJ SOLN
INTRAMUSCULAR | Status: AC
  Filled 2013-03-14: qty 10

## 2013-03-14 MED ORDER — DEXAMETHASONE SODIUM PHOSPHATE 10 MG/ML IJ SOLN
INTRAMUSCULAR | Status: DC | PRN
  Administered 2013-03-14: 10 mg via INTRAVENOUS

## 2013-03-14 SURGICAL SUPPLY — 77 items
ADH SKN CLS APL DERMABOND .7 (GAUZE/BANDAGES/DRESSINGS) ×2
BAG URINE DRAINAGE (UROLOGICAL SUPPLIES) ×3 IMPLANT
BAG URO CATCHER STRL LF (DRAPE) ×3 IMPLANT
BLADE HEX COATED 2.75 (ELECTRODE) ×3 IMPLANT
BLADE SURG 15 STRL LF DISP TIS (BLADE) ×4 IMPLANT
BLADE SURG 15 STRL SS (BLADE) ×3
CANISTER SUCTION 2500CC (MISCELLANEOUS) ×3 IMPLANT
CATH FOLEY 2WAY SLVR  5CC 14FR (CATHETERS) ×1
CATH FOLEY 2WAY SLVR  5CC 16FR (CATHETERS) ×1
CATH FOLEY 2WAY SLVR 5CC 14FR (CATHETERS) ×2 IMPLANT
CATH FOLEY 2WAY SLVR 5CC 16FR (CATHETERS) ×2 IMPLANT
CLOTH BEACON ORANGE TIMEOUT ST (SAFETY) ×3 IMPLANT
COVER MAYO STAND STRL (DRAPES) IMPLANT
COVER SURGICAL LIGHT HANDLE (MISCELLANEOUS) ×3 IMPLANT
DECANTER SPIKE VIAL GLASS SM (MISCELLANEOUS) ×3 IMPLANT
DERMABOND ADVANCED (GAUZE/BANDAGES/DRESSINGS) ×1
DERMABOND ADVANCED .7 DNX12 (GAUZE/BANDAGES/DRESSINGS) IMPLANT
DEVICE CAPIO SLIM SINGLE (INSTRUMENTS) IMPLANT
DEVICE CAPIO SUTURING (INSTRUMENTS) ×1
DEVICE CAPIO SUTURING OPC (INSTRUMENTS) ×2 IMPLANT
DRAIN PENROSE 18X1/4 LTX STRL (WOUND CARE) ×3 IMPLANT
DRAPE CAMERA CLOSED 9X96 (DRAPES) ×2 IMPLANT
DRAPE LG THREE QUARTER DISP (DRAPES) ×3 IMPLANT
ELECT REM PT RETURN 9FT ADLT (ELECTROSURGICAL) ×3
ELECTRODE REM PT RTRN 9FT ADLT (ELECTROSURGICAL) ×2 IMPLANT
GAUZE PACKING 1 X5 YD ST (GAUZE/BANDAGES/DRESSINGS) ×1 IMPLANT
GAUZE PACKING 2X5 YD STERILE (GAUZE/BANDAGES/DRESSINGS) ×2 IMPLANT
GAUZE SPONGE 4X4 16PLY XRAY LF (GAUZE/BANDAGES/DRESSINGS) ×7 IMPLANT
GLOVE BIOGEL M 6.5 STRL (GLOVE) ×3 IMPLANT
GLOVE BIOGEL M STRL SZ7.5 (GLOVE) ×3 IMPLANT
GLOVE ECLIPSE 8.0 STRL XLNG CF (GLOVE) ×6 IMPLANT
GOWN PREVENTION PLUS XLARGE (GOWN DISPOSABLE) ×3 IMPLANT
GOWN STRL REIN XL XLG (GOWN DISPOSABLE) ×6 IMPLANT
HOLDER FOLEY CATH W/STRAP (MISCELLANEOUS) ×2 IMPLANT
IV NS 1000ML (IV SOLUTION) ×3
IV NS 1000ML BAXH (IV SOLUTION) ×2 IMPLANT
KIT BASIN OR (CUSTOM PROCEDURE TRAY) ×3 IMPLANT
NDL MAYO 6 CRC TAPER PT (NEEDLE) ×2 IMPLANT
NEEDLE HYPO 22GX1.5 SAFETY (NEEDLE) IMPLANT
NEEDLE INJECT RIGID (NEEDLE) IMPLANT
NEEDLE INJECT RIGID 21GA 14.6 (NEEDLE) IMPLANT
NEEDLE MAYO .5 CIRCLE (NEEDLE) IMPLANT
NEEDLE MAYO 6 CRC TAPER PT (NEEDLE) ×3 IMPLANT
NS IRRIG 1000ML POUR BTL (IV SOLUTION) ×2 IMPLANT
PACK CYSTO (CUSTOM PROCEDURE TRAY) ×3 IMPLANT
PENCIL BUTTON HOLSTER BLD 10FT (ELECTRODE) ×3 IMPLANT
PLUG CATH AND CAP STER (CATHETERS) ×3 IMPLANT
POSITIONER SURGICAL ARM (MISCELLANEOUS) ×3 IMPLANT
RETRACTOR STAY HOOK 5MM (MISCELLANEOUS) ×4 IMPLANT
SHEET LAVH (DRAPES) ×3 IMPLANT
SLING SYSTEM SPARC (Sling) ×1 IMPLANT
SUT CAPIO ETHIBPND (SUTURE) ×2 IMPLANT
SUT CAPIO POLYGLYCOLIC (SUTURE) IMPLANT
SUT ETHIBOND 0 (SUTURE) ×4 IMPLANT
SUT SILK 2 0 30  PSL (SUTURE)
SUT SILK 2 0 30 PSL (SUTURE) ×2 IMPLANT
SUT SILK 2 0 SH (SUTURE) ×2 IMPLANT
SUT VIC AB 0 CT1 27 (SUTURE) ×3
SUT VIC AB 0 CT1 27XBRD ANTBC (SUTURE) ×4 IMPLANT
SUT VIC AB 2-0 CT1 27 (SUTURE) ×12
SUT VIC AB 2-0 CT1 27XBRD (SUTURE) ×4 IMPLANT
SUT VIC AB 2-0 SH 27 (SUTURE) ×21
SUT VIC AB 2-0 SH 27X BRD (SUTURE) ×12 IMPLANT
SUT VIC AB 3-0 PS2 18 (SUTURE)
SUT VIC AB 3-0 PS2 18XBRD (SUTURE) IMPLANT
SUT VIC AB 3-0 SH 27 (SUTURE) ×6
SUT VIC AB 3-0 SH 27XBRD (SUTURE) ×12 IMPLANT
SUT VIC AB 4-0 PS2 27 (SUTURE) ×3 IMPLANT
SUT VICRYL 0 UR6 27IN ABS (SUTURE) ×16 IMPLANT
SYRINGE 10CC LL (SYRINGE) ×3 IMPLANT
TISSUE REPAIR XENFORM 6X10CM (Tissue) ×1 IMPLANT
TOWEL OR 17X26 10 PK STRL BLUE (TOWEL DISPOSABLE) ×3 IMPLANT
TOWEL OR NON WOVEN STRL DISP B (DISPOSABLE) ×3 IMPLANT
TUBING CONNECTING 10 (TUBING) ×3 IMPLANT
WATER STERILE IRR 1500ML POUR (IV SOLUTION) ×2 IMPLANT
WATER STERILE IRR 500ML POUR (IV SOLUTION) ×2 IMPLANT
YANKAUER SUCT BULB TIP 10FT TU (MISCELLANEOUS) ×3 IMPLANT

## 2013-03-14 NOTE — Care Management Note (Addendum)
    Page 1 of 1   03/15/2013     12:21:44 PM   CARE MANAGEMENT NOTE 03/15/2013  Patient:  JAQUELINE, UBER   Account Number:  [id number]  Date Initiated:  03/14/2013  Documentation initiated by:  Lanier Clam  Subjective/Objective Assessment:   ADMITTED W/URINARY INCONTINENCE,VAULT CYSTOCELE.     Action/Plan:   FROM HOME ALONE.HAS PCP,PHARMACY.   Anticipated DC Date:  03/15/2013   Anticipated DC Plan:  HOME/SELF CARE      DC Planning Services  CM consult      Choice offered to / List presented to:             Status of service:  Completed, signed off Medicare Important Message given?   (If response is "NO", the following Medicare IM given date fields will be blank) Date Medicare IM given:   Date Additional Medicare IM given:    Discharge Disposition:  HOME/SELF CARE  Per UR Regulation:  Reviewed for med. necessity/level of care/duration of stay  If discussed at Long Length of Stay Meetings, dates discussed:    Comments:  03/15/13 Alaia Lordi RN,BSN NCM [phone] D/C HOME NO NEEDS.  03/14/13 Kidada Ging RN,BSN NCM [phone] S/P CYSTOSCOPY.

## 2013-03-14 NOTE — Transfer of Care (Signed)
Immediate Anesthesia Transfer of Care Note  Patient: Rebekah Gibson  Procedure(s) Performed: Procedure(s) with comments: CYSTOSCOPY (N/A) ANTERIOR (CYSTOCELE) AND POSTERIOR REPAIR (RECTOCELE) (N/A) VAULT PROLAPSE WITH SLING GRAFT (N/A) PUBO-VAGINAL SLING (N/A) - SPARC  Patient Location: PACU  Anesthesia Type:General  Level of Consciousness: awake, alert , oriented and patient cooperative  Airway & Oxygen Therapy: Patient Spontanous Breathing and Patient connected to face mask oxygen  Post-op Assessment: Report given to PACU RN, Post -op Vital signs reviewed and stable and Patient moving all extremities  Post vital signs: Reviewed and stable  Complications: No apparent anesthesia complications

## 2013-03-14 NOTE — H&P (Signed)
History of Present Illness   Rebekah Gibson has mixed stress urge incontinence. She is retired from the [employer]. She has worsening pelvic organ prolapse. She was here to discuss her urodynamics. She has complete vault prolapse and almost complete loss of her length anteriorly and posteriorly. She had a negative cough test after cystoscopy. She has urgency and intermittent enuresis. She has moderately severe nocturia and frequency. I thought if she had surgery she likely benefit from a transvaginal vault suspension, cystocele repair and graft, and posterior repair and possible graft posteriorly. She may require a sling. I ordered a CT urogram to assess whether or not she had hydronephrosis.   Prior to her urodynamics, she actually got an Enterococcus urinary tract infection with fever. These notes are well documented. She was given Bactrim and Levaquin. Her serum creatinine was 1.09 with a GFR of 53 mL/minute. Review of Systems: No other change in bowel or neurologic systems.   She did not void prior to the study and was catheterized for 300 mL. Her initial residual urine volume was 0.0 mL. Her maximum capacity was 755 mL. Her bladder was unstable reaching a pressure of 7 cm of water but she did not leak. She leaked a mild amount at 93 cm of water at 300 mL. There is no leakage noted with the Valsalva prior to the reduction of her prolapse. At 600 mL with the prolapse reduced, she had mild leakage at 67 cm of water. During voluntary voiding, she voided 243 mL with a maximum flow of 9 mL/sec. Maximum voiding pressure was 22 to 41 cm of water. She had a very prolonged contraction attempting to void with a very interrupted flow pattern. Her residual was 500 mL. EMG activity was intermittently increased. She had a moderate cystocele fluoroscopically. Bladder was somewhat hyposensitive. She had stress incontinence with and without the prolapse reduced at higher volumes. Bladder contractions were not that  well sustained and again, were intermittent. The details of the urodynamics are signed and dictated on the urodynamic sheet.    Past Medical History Problems  1. History of  Hypercholesterolemia 272.0 2. History of  Hypertension 401.9  Surgical History Problems  1. History of  Hysterectomy V45.77 2. History of  Tubal Ligation V25.2  Current Meds 1. Aspirin 81 MG Oral Tablet; Therapy: (Recorded:17Feb2014) to  Allergies Medication  1. Nitroglycerin SUBL 2. Codeine Derivatives  Family History Problems  1. Maternal history of  Death In The Family Father age 67-Prostate Cancer 2. Maternal history of  Death In The Family Mother age 98-CHF 3. Maternal history of  Family Health Status Number Of Children 4 sons, 1 daughter 4. Maternal history of  Heart Disease V17.49 5. Paternal history of  Kidney Cancer V16.51 6. Paternal history of  Prostate Cancer V16.42  Social History Problems  1. Caffeine Use 2-3 cups daily 2. Marital History - Divorced V61.03 3. Never A Smoker Denied  4. History of  Alcohol Use 5. History of  Tobacco Use 305.1  Vitals Vital Signs [Data Includes: Last 1 Day]  26Mar2014 11:07AM  Blood Pressure: 184 / 81 Temperature: 98.5 F Heart Rate: 81  Assessment Assessed  1. Cystocele 596.89 2. Urge And Stress Incontinence 788.33  Plan Cystocele (596.89)  1. Follow-up Schedule Surgery Office  Follow-up  Done: 26Mar2014  Discussion/Summary   I drew Rebekah Gibson a picture. I talked to her about watchful waiting versus pessary versus a transitional vault suspension with cystocele repair and graft, and posterior repair and  possible graft.   I drew her a picture and we talked about prolapse surgery in detail. Pros, cons, general surgical and anesthetic risks, and other options including behavioral therapy, pessaries, and watchful waiting were discussed. She understands that prolapse repairs are successful in 80-85% of cases for prolapse symptoms and can recur  anteriorly, posteriorly, and/or apically. She understands that in most cases I use a graft and general risks were discussed. Surgical risks were described but not limited to the discussion of injury to neighboring structures including the bowel (with possible life-threatening sepsis and colostomy), bladder, urethra, vagina (all resulting in further surgery), and ureter (resulting in re-implantation). We talked about injury to nerves/soft tissue leading to debilitating and intractable pelvic, abdominal, and lower extremity pain syndromes and neuropathies. The risks of buttock pain, intractable dyspareunia, and vaginal narrowing and shortening with sequelae were discussed. Bleeding risks, transfusion rates, and infection were discussed. The risk of persistent, de novo, or worsening bladder and/or bowel incontinence/dysfunction was discussed. The need for CIC was described as well the usual post-operative course. The patient understands that she might not reach her treatment goal and that she might be worse following surgery.  I told her because of her silent hydronephrosis and a normal serum creatinine, I would not place preoperative stents, but she is at higher risk of ureteral injury with sequelae. I talked about watchful waiting versus behavioral therapy versus sling for her mixed stress urge incontinence.   We talked about a sling in detail. Pros, cons, general surgical and anesthetic risks, and other options including behavioral therapy and watchful waiting were discussed. She understands that slings are generally successful in 90% of cases for stress incontinence, 50% for urge incontinence, and that in a small percentage of cases the incontinence can worsen. The risk of persistent, de novo, or worsening incontinence/dysfunction was discussed. Risks were described but not limited to the discussion of injury to neighboring structures including the bowel (with possible life-threatening sepsis and colostomy),  bladder, urethra, vagina (all resulting in further surgery), and ureter (resulting in re-implantation). We also talked about the risk of retention requiring urethrolysis, extrusion requiring revision, and erosion resulting in further surgery. Bleeding risks and transfusion rates and the risk of infection were discussed. The risk of pelvic and abdominal pain syndromes, dyspareunia, and neuropathies were discussed. The need for CIC was described as well as the usual postoperative course. The patient understands that she might not reach her treatment goal and that she might be worse following surgery. Mesh TV issues were discussed.  She may be at a little bit higher risk of ____ postoperatively because of her urodynamics and history, etc. She understands that she could have persistent or worsening incontinence, or overactive bladder post procedure.   Rebekah Gibson is not sexually active. 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 She thought seriously about a pessary but has decided to proceed with surgery. I think she actually made a good choice in also proceeding with a sling. Certainly, I might err on a few millimeters loser. We will proceed accordingly.    After a thorough review of the management options for the patient's condition the patient  elected to proceed with surgical therapy as noted above. We have discussed the potential benefits and risks of the procedure, side effects of the proposed treatment, the likelihood of the patient achieving the goals of the procedure, and any potential problems that might occur during the procedure or recuperation. Informed consent has been obtained.

## 2013-03-14 NOTE — Progress Notes (Signed)
Agree with previous RN's assessment of patient.  Will continue to monitor patient through end of shift.  

## 2013-03-14 NOTE — Interval H&P Note (Signed)
History and Physical Interval Note:  03/14/2013 7:08 AM  Rebekah Gibson  has presented today for surgery, with the diagnosis of CYSTOCELE RECTOCELE VAULT PROLAPSE AND STRESS INCONTINENCE  The various methods of treatment have been discussed with the patient and family. After consideration of risks, benefits and other options for treatment, the patient has consented to  Procedure(s): CYSTOSCOPY (N/A) ANTERIOR (CYSTOCELE) AND POSTERIOR REPAIR (RECTOCELE) (N/A) VAULT PROLAPSE SLING GRAFT (N/A) VAULT PROLAPSE WITH SLING GRAFT (N/A) as a surgical intervention .  The patient's history has been reviewed, patient examined, no change in status, stable for surgery.  I have reviewed the patient's chart and labs.  Questions were answered to the patient's satisfaction.     [name] A

## 2013-03-14 NOTE — Preoperative (Signed)
Beta Blockers   Reason not to administer Beta Blockers:Not Applicable 

## 2013-03-14 NOTE — Anesthesia Postprocedure Evaluation (Signed)
  Anesthesia Post-op Note  Patient: Rebekah Gibson  Procedure(s) Performed: Procedure(s) (LRB): CYSTOSCOPY (N/A) ANTERIOR (CYSTOCELE) AND POSTERIOR REPAIR (RECTOCELE) (N/A) VAULT PROLAPSE WITH SLING GRAFT (N/A) PUBO-VAGINAL SLING (N/A)  Patient Location: PACU  Anesthesia Type: General  Level of Consciousness: awake and alert   Airway and Oxygen Therapy: Patient Spontanous Breathing  Post-op Pain: mild  Post-op Assessment: Post-op Vital signs reviewed, Patient's Cardiovascular Status Stable, Respiratory Function Stable, Patent Airway and No signs of Nausea or vomiting  Last Vitals:  Filed Vitals:   03/14/13 1158  BP: 132/54  Pulse: 70  Temp: 36.4 C  Resp: 16    Post-op Vital Signs: stable   Complications: No apparent anesthesia complications

## 2013-03-14 NOTE — Op Note (Signed)
Preoperative diagnosis: Complete vault prolapse, cystocele, rectocele, and stress urinary incontinence Postoperative diagnosis: Complete vault prolapse, cystocele, rectocele and stress urinary incontinence Surgery: Vault prolapse repair, cystocele repair and graft, rectocele repair, sling cystourethropexy [facility]) and cystoscopy Surgeon: Dr. Lorin Picket Mayerli Kirst Assistant, Pecola Leisure  The patient has the above diagnoses and consented the above procedure. Extra care was taken with leg positioning to minimize risk of compartment syndrome and neuropathy and deep vein thrombosis. Preoperative laboratory tests were normal. Preoperative antibiotics were given. Patient is known to have bilateral hydronephrosis preoperatively. She had a complete vault prolapse  I marked the vaginal cuff with a 3-0 Vicryl utilizing her vaginal dimples. I instilled 28 cc of a lidocaine epinephrine mixture anteriorly and did my usual T-shaped anterior vaginal wall incision. I sharply dissected the overlying thick anterior vaginal wall from the underlying pubocervical fascia to the white line bilaterally. Mobilization was adequate at the apex. It was obvious at she had likely some peritoneum visualized near the apex and this was left alone.  She did not have a large central defect. I did a 2 layer closure with 2-0 Vicryl not imbricating the bladder neck and not distorting the anatomy. I kept good vaginal length. Following this closure I cystoscoped the patient. She had no distortion of the trigone or ureters. Cystoscopically the cystocele repair looked very good. She had excellent blue jets bilaterally. The blue dye was not that dark and double checked and I was happy with the efflux  I then finger dissected to the initial spine bilaterally sweeping all soft tissues medially. I could feel the sacrospinous ligament medially. With a Capio Slim device I placed a 0 Ethibond more than 1 fingerbreadth medial to the initial spine in a  straight line between the spines. I was very happy with the position. I did a rectal examination and there is no rectal injury or suture in the rectum  Utilizing my usual technique I placed a 0 Vicryl on a UR 6 needle into the pelvic sidewall near the level of the urethrovesical angle away from the future sling  The 10 x 6 well prepared dermal graft was cut in the shape of a trapezoid and sewed in place tension-free. I was very pleased with this position  The apical posterior tissue was sewn to the middle aspect of the graft with 2-0 Vicryl using 3 interrupted sutures in the midline and just to the left and right of midline. The sutures want to mucosa full thickness and not through bowel wall. This maneuver was done to help lengthen the apex posteriorly  I trimmed an appropriate amount of anterior vaginal wall mucosa and close anterior vaginal wall with running 2-0 Vicryl and CT1 needle. I repeated the rectal examination and she had a large rectocele and quite a bit of length with bulging.  2 Allis clamps were placed on the hymenal ring. 20 cc of a lidocaine epinephrine mixture was utilized. I made a midline incision almost to the apex posteriorly. I did this after removing a small triangle perineal skin. I sharply dissected the rectovaginal fascia from the overlying posterior vaginal wall mucosa. I mobilized appropriate apically but it was obvious a but could not go a cephalad as I might like or I would've reach my anterior dissection.  I did a rectal examination and she had a large midline and apical defect. She had no enterocele I closed the vaginal fascia in 2 layers imbricating in the midline with a good closure near the apex. I repeated  the rectal examination there is no injury or distortion of rectum. I was pleased to the rectocele repair. I did not utilize a graft  I trimmed an appropriate amount of posterior vaginal wall mucosa and closed with running 2-0 Vicryl and CT1 needle exteriorizing it  at the introitus I did 1 gentle 0 Vicryl perineal body closure. I then closed the perineum with subcuticular suture.  She had excellent length anterior and posterior with good apical support. I placed the longer vaginal speculum and did the sling.  I made 2 less than 1 cm incisions 1 fingerbreadth above the symphysis pubis 1.5 cm lateral to the midline. I made an appropriate 2 cm incision suburethral he after instilling 3 cc of a lidocaine epinephrine mixture. It was of appropriate depth subcutaneous close with good tissue in spite of her vaginal wall atrophy at the end of the case.  With the bladder emptied I passed a SPARC needle on top of along the back the symphysis pubis under the pulp of my index finger bilaterally using my box technique. I then cystoscoped the patient. There is no injury to bladder. There was little to no movement of bladder with movement of the trochars. There was excellent blue jets bilaterally.  With the bladder emptied I attached the [facility] sling and brought it up through the retropubic space tension it over the middle aspect of the moderate-sized [place]. I cut below the blue dots irrigated the sheath and removed the sheaths. I was very pleased with the tension of the sling and its position. It was good hypermobility and no springback effect.  I close anterior vaginal wall with running 2-0 Vicryl on a CT1 needle followed by my 2 interrupted sutures.  Sling was cut below skin level and after irrigation I closed the incision with interrupted 4-0 Vicryl and Dermabond. Irrigation with antibiotic heavy utilized throughout the case vaginally. Blood loss was less than 100 mL. Leg position was good. Urine was clear blue and draining  I was very pleased with the patient's surgery and hopefully it will reach her treatment goal

## 2013-03-14 NOTE — Anesthesia Preprocedure Evaluation (Addendum)
Anesthesia Evaluation  Patient identified by MRN, date of birth, ID band Patient awake    Reviewed: Allergy & Precautions, H&P , NPO status , Patient's Chart, lab work & pertinent test results  Airway Mallampati: II TM Distance: >3 FB Neck ROM: Full    Dental no notable dental hx.    Pulmonary neg pulmonary ROS,  breath sounds clear to auscultation  Pulmonary exam normal       Cardiovascular hypertension, Pt. on medications negative cardio ROS  Rhythm:Regular Rate:Normal     Neuro/Psych negative neurological ROS  negative psych ROS   GI/Hepatic negative GI ROS, Neg liver ROS,   Endo/Other  negative endocrine ROS  Renal/GU negative Renal ROS  negative genitourinary   Musculoskeletal negative musculoskeletal ROS (+)   Abdominal   Peds negative pediatric ROS (+)  Hematology negative hematology ROS (+)   Anesthesia Other Findings   Reproductive/Obstetrics negative OB ROS                           Anesthesia Physical Anesthesia Plan  ASA: II  Anesthesia Plan: General   Post-op Pain Management:    Induction: Intravenous  Airway Management Planned: Oral ETT  Additional Equipment:   Intra-op Plan:   Post-operative Plan: Extubation in OR  Informed Consent: I have reviewed the patients History and Physical, chart, labs and discussed the procedure including the risks, benefits and alternatives for the proposed anesthesia with the patient or authorized representative who has indicated his/her understanding and acceptance.   Dental advisory given  Plan Discussed with: CRNA  Anesthesia Plan Comments:         Anesthesia Quick Evaluation  

## 2013-03-15 ENCOUNTER — Encounter (HOSPITAL_COMMUNITY): Payer: Self-pay | Admitting: Urology

## 2013-03-15 LAB — BASIC METABOLIC PANEL
BUN: 10 mg/dL (ref 6–23)
Chloride: 98 mEq/L (ref 96–112)
Creatinine, Ser: 0.82 mg/dL (ref 0.50–1.10)
GFR calc Af Amer: 85 mL/min — ABNORMAL LOW (ref >90)
Glucose, Bld: 156 mg/dL — ABNORMAL HIGH (ref 70–99)
Potassium: 3.6 mEq/L (ref 3.5–5.1)

## 2013-03-15 MED ORDER — ONDANSETRON HCL 4 MG/2ML IJ SOLN
4.0000 mg | INTRAMUSCULAR | Status: DC | PRN
  Administered 2013-03-15: 4 mg via INTRAVENOUS
  Filled 2013-03-15: qty 2

## 2013-03-15 NOTE — Progress Notes (Signed)
Vitals normal Laboratory tests normal- Hb 9.9 Patient alert and stable Pain minimal and well-controlled- midline buttock pain almost completely gone mobile Post treatment course discussed in detail Followup discussed in detail See orders

## 2013-03-15 NOTE — Discharge Summary (Signed)
Date of admission: 03/14/2013  Date of discharge: 03/15/2013  Admission diagnosis: Vault prolapse  Discharge diagnosis: Vault prolapse  Secondary diagnoses: rectocele, cystocele, stress incontinence  History and Physical: For full details, please see admission history and physical. Briefly, Rebekah Gibson is a 67 y.o. year old patient with the above diagnosis. Surgery for above went very well with uneventful post op course.   Hospital Course: as above; post labs normal Hb 9.9  Laboratory values:  Recent Labs  03/14/13 1120 03/15/13 0440  HGB 11.0* 9.9*  HCT 31.4* 29.2*    Recent Labs  03/15/13 0440  CREATININE 0.82    Disposition: Home  Discharge instruction: The patient was instructed to be ambulatory but told to refrain from heavy lifting, strenuous activity, or driving. Reviewed in detail  Discharge medications:    Medication List    STOP taking these medications       ALEVE 220 MG Caps  Generic drug:  Naproxen Sodium     aspirin EC 81 MG tablet      TAKE these medications       HYDROcodone-acetaminophen 5-325 MG per tablet  Commonly known as:  NORCO  Take 1-2 tablets by mouth every 6 (six) hours as needed for pain.     loratadine 10 MG tablet  Commonly known as:  CLARITIN  Take 10 mg by mouth daily as needed for allergies.     nitrofurantoin 100 MG capsule  Commonly known as:  MACRODANTIN  Take 100 mg by mouth daily.     olmesartan-hydrochlorothiazide 40-25 MG per tablet  Commonly known as:  BENICAR HCT  Take 0.5-1 tablets by mouth daily as needed (for high blood pressure).        Followup:      Follow-up Information   Follow up with Katurah Karapetian A, MD. (office will call you with follow up appt.)    Contact information:   [address] [facility] [place] [address] [phone]

## 2013-07-29 IMAGING — CR DG CHEST 2V
2 series · 2 of 2 positions shown · non-contrast
Comparison: 12/05/2006

CLINICAL DATA: Fever, pelvic pain

CHEST - 2 VIEW

[w chest lat (1 of 2)]
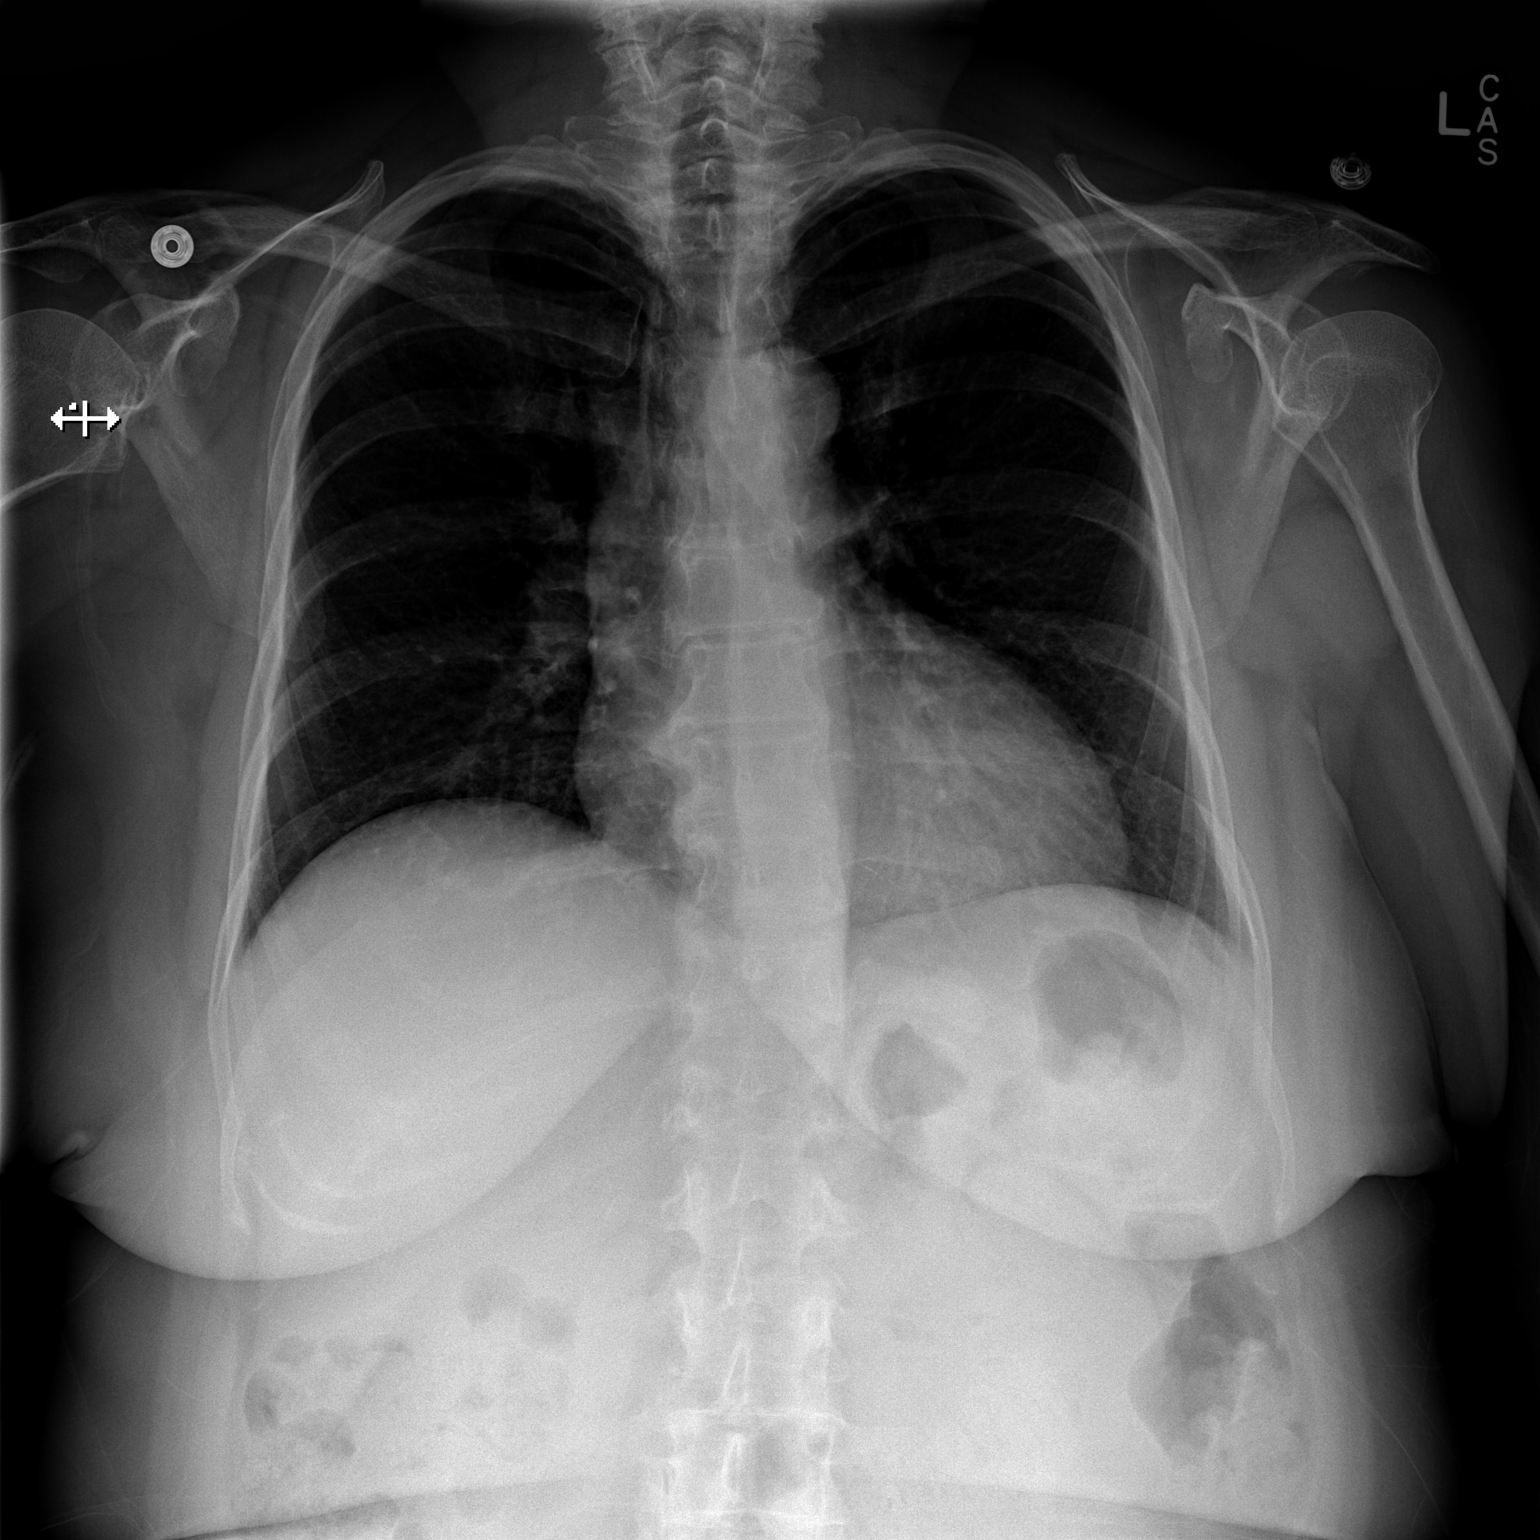

[w chest lat (2 of 2)]
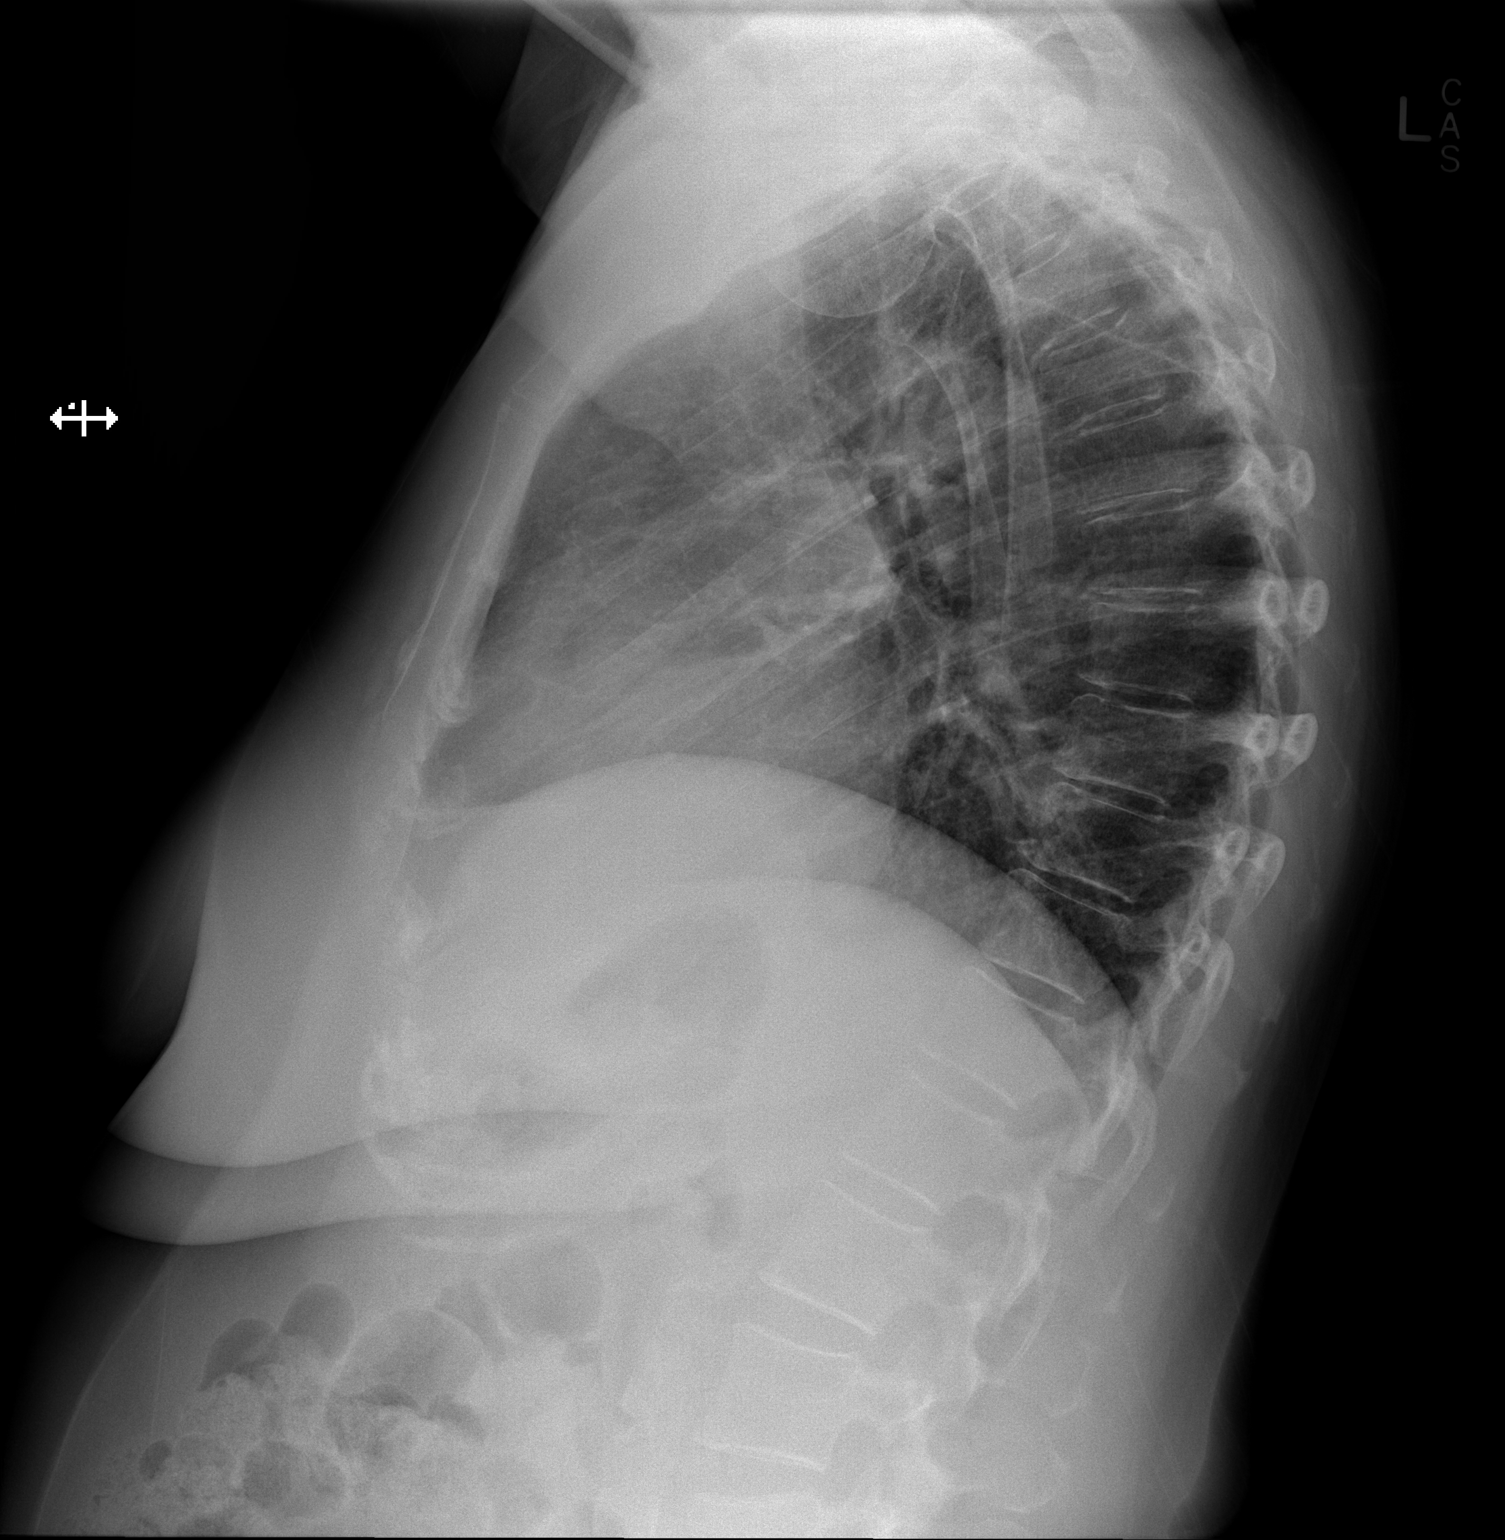

[2 of 2 positions shown; findings below may reference images not displayed]

FINDINGS: Lungs are clear. No pleural effusion or pneumothorax.

The heart is top normal size.

Mild degenerative changes of the visualized thoracolumbar spine.
IMPRESSION: No evidence of acute cardiopulmonary disease.

## 2016-05-08 ENCOUNTER — Emergency Department: Payer: PPO

## 2016-05-08 ENCOUNTER — Emergency Department
Admission: EM | Admit: 2016-05-08 | Discharge: 2016-05-08 | Disposition: A | Discharge status: Home / Self Care | Payer: PPO | Attending: Student in an Organized Health Care Education/Training Program | Admitting: Student in an Organized Health Care Education/Training Program

## 2016-05-08 DIAGNOSIS — R0602 Shortness of breath: Secondary | ICD-10-CM | POA: Diagnosis not present

## 2016-05-08 DIAGNOSIS — I10 Essential (primary) hypertension: Secondary | ICD-10-CM | POA: Insufficient documentation

## 2016-05-08 DIAGNOSIS — N390 Urinary tract infection, site not specified: Secondary | ICD-10-CM | POA: Diagnosis not present

## 2016-05-08 DIAGNOSIS — R350 Frequency of micturition: Secondary | ICD-10-CM | POA: Diagnosis not present

## 2016-05-08 DIAGNOSIS — I160 Hypertensive urgency: Secondary | ICD-10-CM | POA: Diagnosis not present

## 2016-05-08 DIAGNOSIS — R0789 Other chest pain: Secondary | ICD-10-CM | POA: Diagnosis not present

## 2016-05-08 LAB — URINALYSIS COMPLETE WITH MICROSCOPIC (ARMC ONLY)
Bilirubin Urine: NEGATIVE
Glucose, UA: 50 mg/dL — AB
Nitrite: NEGATIVE
PH: 5 (ref 5.0–8.0)
PROTEIN: 30 mg/dL — AB
Specific Gravity, Urine: 1.013 (ref 1.005–1.030)

## 2016-05-08 LAB — CBC
HEMATOCRIT: 42.1 % (ref 35.0–47.0)
HEMOGLOBIN: 14.7 g/dL (ref 12.0–16.0)
MCH: 28.6 pg (ref 26.0–34.0)
MCHC: 35 g/dL (ref 32.0–36.0)
MCV: 81.9 fL (ref 80.0–100.0)
Platelets: 236 10*3/uL (ref 150–440)
RBC: 5.14 MIL/uL (ref 3.80–5.20)
RDW: 13.6 % (ref 11.5–14.5)
WBC: 10.8 10*3/uL (ref 3.6–11.0)

## 2016-05-08 LAB — COMPREHENSIVE METABOLIC PANEL
ALBUMIN: 4.7 g/dL (ref 3.5–5.0)
ALT: 35 U/L (ref 14–54)
ANION GAP: 10 (ref 5–15)
AST: 28 U/L (ref 15–41)
Alkaline Phosphatase: 114 U/L (ref 38–126)
BILIRUBIN TOTAL: 1.1 mg/dL (ref 0.3–1.2)
BUN: 14 mg/dL (ref 6–20)
CO2: 24 mmol/L (ref 22–32)
Calcium: 9.5 mg/dL (ref 8.9–10.3)
Chloride: 102 mmol/L (ref 101–111)
Creatinine, Ser: 0.64 mg/dL (ref 0.44–1.00)
GFR calc Af Amer: >60 mL/min (ref >60)
GFR calc non Af Amer: >60 mL/min (ref >60)
GLUCOSE: 189 mg/dL — AB (ref 65–99)
POTASSIUM: 3.8 mmol/L (ref 3.5–5.1)
SODIUM: 136 mmol/L (ref 135–145)
Total Protein: 7.7 g/dL (ref 6.5–8.1)

## 2016-05-08 LAB — TROPONIN I

## 2016-05-08 LAB — LACTIC ACID, PLASMA: LACTIC ACID, VENOUS: 1.5 mmol/L (ref 0.5–1.9)

## 2016-05-08 MED ORDER — SODIUM CHLORIDE 0.9 % IV BOLUS (SEPSIS)
1000.0000 mL | Freq: Once | INTRAVENOUS | Status: AC
  Administered 2016-05-08: 1000 mL via INTRAVENOUS

## 2016-05-08 MED ORDER — HYDROCHLOROTHIAZIDE 12.5 MG PO CAPS: 12.5000 mg | ORAL_CAPSULE | Freq: Every day | ORAL | 0 refills | Status: DC

## 2016-05-08 MED ORDER — HYDROCHLOROTHIAZIDE 12.5 MG PO CAPS
12.5000 mg | ORAL_CAPSULE | Freq: Every day | ORAL | Status: DC
  Administered 2016-05-08: 12.5 mg via ORAL
  Filled 2016-05-08: qty 1

## 2016-05-08 MED ORDER — CEPHALEXIN 500 MG PO CAPS
500.0000 mg | ORAL_CAPSULE | Freq: Once | ORAL | Status: AC
  Administered 2016-05-08: 500 mg via ORAL
  Filled 2016-05-08: qty 1

## 2016-05-08 MED ORDER — LORAZEPAM 1 MG PO TABS
1.0000 mg | ORAL_TABLET | Freq: Once | ORAL | Status: AC
  Administered 2016-05-08: 1 mg via ORAL
  Filled 2016-05-08: qty 1

## 2016-05-08 MED ORDER — CEPHALEXIN 500 MG PO CAPS: 500.0000 mg | ORAL_CAPSULE | Freq: Four times a day (QID) | ORAL | 0 refills | Status: AC

## 2016-05-08 MED ORDER — AMLODIPINE BESYLATE 5 MG PO TABS
5.0000 mg | ORAL_TABLET | Freq: Once | ORAL | Status: AC
  Administered 2016-05-08: 5 mg via ORAL
  Filled 2016-05-08: qty 1

## 2016-05-08 NOTE — ED Provider Notes (Signed)
[facility] Emergency Department Provider Note    First MD Initiated Contact with Patient 05/08/16 1614     (approximate)  I have reviewed the triage vital signs and the nursing notes.   HISTORY  Chief Complaint Shortness of Breath and Urinary Frequency    HPI Rebekah Gibson is a 70 y.o. female history of hypertension who is not currently on any antihypertensive medications presents to the ER after having persistently elevated blood pressure measurements and an episode of chest tightness this morning. Patient states the blood pressure was measured at 208/130 this morning she began to have chest tightness and anxiety related to elevated blood pressure. States that she has been fully evaluated for coronary artery disease at [facility] with negative casts and negative stress test. Has not been on antihypertensive medication in over 5 years. She denies any headache numbness or tingling at this time. No change in urinary output but does admit to a foul odor. She does have some shortness of breath secondary to the anxiety but denies any cough, pain with inspiration, lower extremity edema.   Past Medical History:  Diagnosis Date  . EKG, abnormal   . Female bladder prolapse   . Frequency of urination   . History of UTI   . History of vertigo   . Hypertension   . Nocturia   . Pre-diabetes    DIET CONTROLLED    There are no active problems to display for this patient.   Past Surgical History:  Procedure Laterality Date  . ABDOMINAL HYSTERECTOMY    . ANTERIOR AND POSTERIOR REPAIR N/A 03/14/2013   Procedure: ANTERIOR (CYSTOCELE) AND POSTERIOR REPAIR (RECTOCELE);  Surgeon: Martina Sinner, MD;  Location: [facility];  Service: Urology;  Laterality: N/A;  . BLADDER SUSPENSION N/A 03/14/2013   Procedure: VAULT PROLAPSE WITH SLING GRAFT;  Surgeon: Martina Sinner, MD;  Location: [facility];  Service: Urology;  Laterality: N/A;  . CYSTOSCOPY N/A 03/14/2013   Procedure:  CYSTOSCOPY;  Surgeon: Martina Sinner, MD;  Location: [facility];  Service: Urology;  Laterality: N/A;  . PUBOVAGINAL SLING N/A 03/14/2013   Procedure: Leonides Grills;  Surgeon: Martina Sinner, MD;  Location: [facility];  Service: Urology;  Laterality: N/A;  SPARC  . TUBAL LIGATION      Prior to Admission medications   Medication Sig Start Date End Date Taking? Authorizing Provider  HYDROcodone-acetaminophen (NORCO) 5-325 MG per tablet Take 1-2 tablets by mouth every 6 (six) hours as needed for pain. 03/14/13   Harrie Foreman, PA-C  loratadine (CLARITIN) 10 MG tablet Take 10 mg by mouth daily as needed for allergies.    Historical Provider, MD  nitrofurantoin (MACRODANTIN) 100 MG capsule Take 100 mg by mouth daily.    Historical Provider, MD  olmesartan-hydrochlorothiazide (BENICAR HCT) 40-25 MG per tablet Take 0.5-1 tablets by mouth daily as needed (for high blood pressure).     Historical Provider, MD    Allergies Codeine; Erythromycin; and Nitroglycerin  No family history on file.  Social History Social History  Substance Use Topics  . Smoking status: Never Smoker  . Smokeless tobacco: Never Used  . Alcohol use No    Review of Systems Patient denies headaches, rhinorrhea, blurry vision, numbness, shortness of breath, chest pain, edema, cough, abdominal pain, nausea, vomiting, diarrhea, dysuria, fevers, rashes or hallucinations unless otherwise stated above in HPI. ____________________________________________   PHYSICAL EXAM:  VITAL SIGNS: Vitals:   05/08/16 1731 05/08/16 1757  BP: (!) 178/85 Marland Kitchen)  187/107  Pulse: 96 (!) 101  Resp: 19 16  Temp:      Constitutional: Alert and oriented. Well appearing and in no acute distress. Eyes: Conjunctivae are normal. PERRL. EOMI. Head: Atraumatic. Nose: No congestion/rhinnorhea. Mouth/Throat: Mucous membranes are moist.  Oropharynx non-erythematous. Neck: No stridor. Painless ROM. No cervical spine tenderness to  palpation Hematological/Lymphatic/Immunilogical: No cervical lymphadenopathy. Cardiovascular: Normal rate, regular rhythm. Grossly normal heart sounds.  Good peripheral circulation. Respiratory: Normal respiratory effort.  No retractions. Lungs CTAB. Gastrointestinal: Soft and nontender. No distention. No abdominal bruits. No CVA tenderness. Genitourinary:  Musculoskeletal: No lower extremity tenderness nor edema.  No joint effusions. Neurologic:  Normal speech and language. No gross focal neurologic deficits are appreciated. No gait instability. Skin:  Skin is warm, dry and intact. No rash noted. Psychiatric: Mood and affect are normal. Speech and behavior are normal.  ____________________________________________   LABS (all labs ordered are listed, but only abnormal results are displayed)  Results for orders placed or performed during the hospital encounter of 05/08/16 (from the past 24 hour(s))  CBC     Status: None   Collection Time: 05/08/16  4:19 PM  Result Value Ref Range   WBC 10.8 3.6 - 11.0 K/uL   RBC 5.14 3.80 - 5.20 MIL/uL   Hemoglobin 14.7 12.0 - 16.0 g/dL   HCT 16.1 09.6 - 04.5 %   MCV 81.9 80.0 - 100.0 fL   MCH 28.6 26.0 - 34.0 pg   MCHC 35.0 32.0 - 36.0 g/dL   RDW 40.9 81.1 - 91.4 %   Platelets 236 150 - 440 K/uL  Comprehensive metabolic panel     Status: Abnormal   Collection Time: 05/08/16  4:19 PM  Result Value Ref Range   Sodium 136 135 - 145 mmol/L   Potassium 3.8 3.5 - 5.1 mmol/L   Chloride 102 101 - 111 mmol/L   CO2 24 22 - 32 mmol/L   Glucose, Bld 189 (H) 65 - 99 mg/dL   BUN 14 6 - 20 mg/dL   Creatinine, Ser 7.82 0.44 - 1.00 mg/dL   Calcium 9.5 8.9 - 95.6 mg/dL   Total Protein 7.7 6.5 - 8.1 g/dL   Albumin 4.7 3.5 - 5.0 g/dL   AST 28 15 - 41 U/L   ALT 35 14 - 54 U/L   Alkaline Phosphatase 114 38 - 126 U/L   Total Bilirubin 1.1 0.3 - 1.2 mg/dL   GFR calc non Af Amer >60 >60 mL/min   GFR calc Af Amer >60 >60 mL/min   Anion gap 10 5 - 15  Troponin  I     Status: None   Collection Time: 05/08/16  4:19 PM  Result Value Ref Range   Troponin I <0.03 <0.03 ng/mL  Urinalysis complete, with microscopic ([facility] only)     Status: Abnormal   Collection Time: 05/08/16  4:20 PM  Result Value Ref Range   Color, Urine YELLOW (A) YELLOW   APPearance CLEAR (A) CLEAR   Glucose, UA 50 (A) NEGATIVE mg/dL   Bilirubin Urine NEGATIVE NEGATIVE   Ketones, ur 1+ (A) NEGATIVE mg/dL   Specific Gravity, Urine 1.013 1.005 - 1.030   Hgb urine dipstick 1+ (A) NEGATIVE   pH 5.0 5.0 - 8.0   Protein, ur 30 (A) NEGATIVE mg/dL   Nitrite NEGATIVE NEGATIVE   Leukocytes, UA 1+ (A) NEGATIVE   RBC / HPF 0-5 0 - 5 RBC/hpf   WBC, UA 6-30 0 - 5 WBC/hpf   Bacteria,  UA MANY (A) NONE SEEN   Squamous Epithelial / LPF 0-5 (A) NONE SEEN   Mucous PRESENT   Lactic acid, plasma     Status: None   Collection Time: 05/08/16  4:20 PM  Result Value Ref Range   Lactic Acid, Venous 1.5 0.5 - 1.9 mmol/L   ____________________________________________  EKG  Time: 16:09  Indication: chest pain  Rate: 120  Rhythm: SR Axis: left Other: RBBB with non sepcific ST changes, no acute ST elevations ____________________________________________  RADIOLOGY  CXR IMPRESSION: No edema or consolidation.  Aortic atherosclerosis noted. ____________________________________________   PROCEDURES  Procedure(s) performed: none    Critical Care performed: no ____________________________________________   INITIAL IMPRESSION / ASSESSMENT AND PLAN / ED COURSE  Pertinent labs & imaging results that were available during my care of the patient were reviewed by me and considered in my medical decision making (see chart for details).  DDX: essential htn, acs, chf, renal insufficiency, cardiomyopathy, pheo  Rebekah Gibson is a 70 y.o. who presents to the ED with chief complaint of elevated blood pressure at episode of chest tightness earlier today. She is currently afebrile but hypertensive and  tachycardic. Patient currently stated that she is anxious at this time. Denies any current chest pain or pressure. Does not appear to have any stroke symptoms at this time. She is well-perfused, calm and has no complaints at this time.  She is currently not on any antihypertensive medications with a previous history of elevated blood pressure given her episode of chest tightness will check EKG, troponin as well as renal function labs in addition to a chest x-ray to evaluate for signs or symptoms of congestive heart failure.  Clinical Course   Laboratory and imaging results without evidence of acute end organ damage secondary to high blood pressure. No evidence of ACS. Her exam nonfocal. Blood pressure improved with anxiolytics is as well as by mouth antihypertensive medication. UA does show evidence of acute urinary tract infection. We'll start patient on Keflex. Will also provide patient with prescription for HCTZ and for referral to PCP for repeat lab checks and blood pressure management. Have discussed with the patient and available family all diagnostics and treatments performed thus far and all questions were answered to the best of my ability. The patient demonstrates understanding and agreement with plan.   ____________________________________________   FINAL CLINICAL IMPRESSION(S) / ED DIAGNOSES  Final diagnoses:  UTI (lower urinary tract infection)  Essential hypertension      NEW MEDICATIONS STARTED DURING THIS VISIT:  New Prescriptions   No medications on file     Note:  This document was prepared using Dragon voice recognition software and may include unintentional dictation errors.    Willy Eddy, MD 05/09/16 [phone]

## 2016-05-08 NOTE — ED Notes (Signed)
Patient seen at [facility] urgent care for urinary frequency. Patient was found to be hypertensive and tachycardic. Patient states that she started having chest tightness this morning in the center of her chest. Patient denies any radiation of the pain. Patient denies N/V or shortness of breath but states that she has felt "clostraphobic" the past week.

## 2016-05-08 NOTE — ED Triage Notes (Signed)
Pt arrives to ER via POV from [facility] for evaluation of SOB, hypertension and urinary frequency. Pt states BP has been going up and back down X 2 days. Intermittent SOB accompanied with chest tightness, on exertion. Pt alert and oriented X4, active, cooperative, pt in NAD. RR even and unlabored, color WNL.

## 2016-06-05 DIAGNOSIS — Z79899 Other long term (current) drug therapy: Secondary | ICD-10-CM | POA: Diagnosis not present

## 2016-06-05 DIAGNOSIS — N390 Urinary tract infection, site not specified: Secondary | ICD-10-CM | POA: Diagnosis not present

## 2016-06-05 DIAGNOSIS — Z0000 Encounter for general adult medical examination without abnormal findings: Secondary | ICD-10-CM | POA: Diagnosis not present

## 2016-06-05 DIAGNOSIS — I10 Essential (primary) hypertension: Secondary | ICD-10-CM | POA: Diagnosis not present

## 2016-06-12 DIAGNOSIS — I10 Essential (primary) hypertension: Secondary | ICD-10-CM | POA: Diagnosis not present

## 2016-06-12 DIAGNOSIS — Z79899 Other long term (current) drug therapy: Secondary | ICD-10-CM | POA: Diagnosis not present

## 2016-12-28 DIAGNOSIS — R7301 Impaired fasting glucose: Secondary | ICD-10-CM | POA: Diagnosis not present

## 2016-12-28 DIAGNOSIS — Z79899 Other long term (current) drug therapy: Secondary | ICD-10-CM | POA: Diagnosis not present

## 2016-12-28 DIAGNOSIS — I10 Essential (primary) hypertension: Secondary | ICD-10-CM | POA: Diagnosis not present

## 2017-01-04 DIAGNOSIS — Z79899 Other long term (current) drug therapy: Secondary | ICD-10-CM | POA: Diagnosis not present

## 2017-01-04 DIAGNOSIS — I10 Essential (primary) hypertension: Secondary | ICD-10-CM | POA: Diagnosis not present

## 2017-01-04 DIAGNOSIS — E119 Type 2 diabetes mellitus without complications: Secondary | ICD-10-CM | POA: Diagnosis not present

## 2017-01-04 DIAGNOSIS — E7800 Pure hypercholesterolemia, unspecified: Secondary | ICD-10-CM | POA: Diagnosis not present

## 2018-06-14 DIAGNOSIS — E119 Type 2 diabetes mellitus without complications: Secondary | ICD-10-CM | POA: Diagnosis not present

## 2018-06-14 DIAGNOSIS — E7800 Pure hypercholesterolemia, unspecified: Secondary | ICD-10-CM | POA: Diagnosis not present

## 2018-06-14 DIAGNOSIS — Z79899 Other long term (current) drug therapy: Secondary | ICD-10-CM | POA: Diagnosis not present

## 2018-06-21 DIAGNOSIS — Z0000 Encounter for general adult medical examination without abnormal findings: Secondary | ICD-10-CM | POA: Diagnosis not present

## 2018-07-29 DIAGNOSIS — H6123 Impacted cerumen, bilateral: Secondary | ICD-10-CM | POA: Diagnosis not present

## 2018-07-29 DIAGNOSIS — H903 Sensorineural hearing loss, bilateral: Secondary | ICD-10-CM | POA: Diagnosis not present

## 2018-08-08 DIAGNOSIS — H903 Sensorineural hearing loss, bilateral: Secondary | ICD-10-CM | POA: Diagnosis not present

## 2018-10-26 ENCOUNTER — Other Ambulatory Visit: Payer: Self-pay | Admitting: Pharmacist

## 2018-10-26 NOTE — Patient Outreach (Signed)
[facility]) Care Management  10/26/2018  Rebekah Gibson 1945-10-23 [id number]   Incoming call from Rebekah Gibson in response to the [facility] Medication Adherence Campaign. Speak with patient. HIPAA identifiers verified and verbal consent received.  [name] reports that she is currently taking losartan 50 mg once daily as directed. Denies any missed doses or barriers to adherence. Reports that she had been only taking a half of a tablet months ago, but resumed taking a full tablet due to her blood pressure being elevated with the lower dose.   Reports that she stopped taking atorvastatin, previously prescribed by her PCP, due to "flu-like" side effect. Per note in chart from 06/21/18, provider was aware that patient had stopped taking this medication, but advised her to try again. Patient reports that she has instead been taking Red Yeast Rice over the counter. Counsel patient that, unlike prescription medications, supplements are not FDA approved or regulated and still requires follow up monitoring. Let patient know that there is insufficient data to confirm that this supplement is safe and effective. Encourage patient to follow up with her PCP to discuss trying an alternative prescription cholesterol medication and to let him know that she has been taking the Red Yeast Rice. Patient states that she will wait until her appointment with him in March to discuss this.   Patient reports that Dr. Madelin Rear is her current PCP. Will request that this be updated in patient's chart.  Patient denies any further medication questions/concerns at this time.   PLAN  1) Will call patient's PCP office to let him know that patient reports currently taking the red yeast rice supplement in place of atorvastatin.  2) Will then close pharmacy episode.  Duanne Moron, PharmD, [facility] Clinical Pharmacist [facility] Care Management [phone]

## 2018-10-26 NOTE — Addendum Note (Signed)
Addended by: Duanne Moron A on: 10/26/2018 03:57 PM   Modules accepted: Orders

## 2018-10-26 NOTE — Patient Outreach (Signed)
[facility]) Care Management  10/26/2018  SYDNA THACKERY [birth year] [id number]   Outreach call to Edwena Bunde regarding her request for follow up from the [facility] Medication Adherence Campaign, which contacted her on 10/24/2018 regarding adherence to losartan. Left a HIPAA compliant message on the patient's voicemail.   Duanne Moron, PharmD, [facility] Clinical Pharmacist [facility] Care Management [phone]

## 2018-10-26 NOTE — Patient Outreach (Signed)
[facility]) Care Management  10/26/2018  Rebekah Gibson Feb 05, 1946 [id number]   Call patient's PCP office to let him know that patient reports currently taking the red yeast rice supplement in place of atorvastatin. Leave a message with Lelon Mast in Dr. Flavia Shipper office.  Duanne Moron, PharmD, [facility] Clinical Pharmacist [facility] Care Management [phone]

## 2019-01-16 DIAGNOSIS — E119 Type 2 diabetes mellitus without complications: Secondary | ICD-10-CM | POA: Diagnosis not present

## 2019-01-16 DIAGNOSIS — Z79899 Other long term (current) drug therapy: Secondary | ICD-10-CM | POA: Diagnosis not present

## 2019-01-16 DIAGNOSIS — E7800 Pure hypercholesterolemia, unspecified: Secondary | ICD-10-CM | POA: Diagnosis not present

## 2019-01-23 DIAGNOSIS — M791 Myalgia, unspecified site: Secondary | ICD-10-CM | POA: Diagnosis not present

## 2019-01-23 DIAGNOSIS — Z79899 Other long term (current) drug therapy: Secondary | ICD-10-CM | POA: Diagnosis not present

## 2019-01-23 DIAGNOSIS — E7800 Pure hypercholesterolemia, unspecified: Secondary | ICD-10-CM | POA: Diagnosis not present

## 2019-01-23 DIAGNOSIS — T466X5A Adverse effect of antihyperlipidemic and antiarteriosclerotic drugs, initial encounter: Secondary | ICD-10-CM | POA: Diagnosis not present

## 2019-01-23 DIAGNOSIS — E1122 Type 2 diabetes mellitus with diabetic chronic kidney disease: Secondary | ICD-10-CM | POA: Diagnosis not present

## 2019-01-23 DIAGNOSIS — I129 Hypertensive chronic kidney disease with stage 1 through stage 4 chronic kidney disease, or unspecified chronic kidney disease: Secondary | ICD-10-CM | POA: Diagnosis not present

## 2019-07-31 ENCOUNTER — Encounter (INDEPENDENT_AMBULATORY_CARE_PROVIDER_SITE_OTHER): Payer: Self-pay

## 2019-09-07 DIAGNOSIS — E1122 Type 2 diabetes mellitus with diabetic chronic kidney disease: Secondary | ICD-10-CM | POA: Diagnosis not present

## 2019-09-07 DIAGNOSIS — I129 Hypertensive chronic kidney disease with stage 1 through stage 4 chronic kidney disease, or unspecified chronic kidney disease: Secondary | ICD-10-CM | POA: Diagnosis not present

## 2019-09-07 DIAGNOSIS — Z79899 Other long term (current) drug therapy: Secondary | ICD-10-CM | POA: Diagnosis not present

## 2019-09-07 DIAGNOSIS — E7800 Pure hypercholesterolemia, unspecified: Secondary | ICD-10-CM | POA: Diagnosis not present

## 2019-09-14 DIAGNOSIS — Z23 Encounter for immunization: Secondary | ICD-10-CM | POA: Diagnosis not present

## 2019-09-14 DIAGNOSIS — I10 Essential (primary) hypertension: Secondary | ICD-10-CM | POA: Diagnosis not present

## 2019-09-14 DIAGNOSIS — Z0000 Encounter for general adult medical examination without abnormal findings: Secondary | ICD-10-CM | POA: Diagnosis not present

## 2019-09-14 DIAGNOSIS — E7800 Pure hypercholesterolemia, unspecified: Secondary | ICD-10-CM | POA: Diagnosis not present

## 2019-09-14 DIAGNOSIS — Z79899 Other long term (current) drug therapy: Secondary | ICD-10-CM | POA: Diagnosis not present

## 2019-09-14 DIAGNOSIS — E119 Type 2 diabetes mellitus without complications: Secondary | ICD-10-CM | POA: Diagnosis not present

## 2020-03-06 DIAGNOSIS — Z79899 Other long term (current) drug therapy: Secondary | ICD-10-CM | POA: Diagnosis not present

## 2020-03-06 DIAGNOSIS — E119 Type 2 diabetes mellitus without complications: Secondary | ICD-10-CM | POA: Diagnosis not present

## 2020-03-06 DIAGNOSIS — E7800 Pure hypercholesterolemia, unspecified: Secondary | ICD-10-CM | POA: Diagnosis not present

## 2020-03-14 DIAGNOSIS — I152 Hypertension secondary to endocrine disorders: Secondary | ICD-10-CM | POA: Diagnosis not present

## 2020-03-14 DIAGNOSIS — E7800 Pure hypercholesterolemia, unspecified: Secondary | ICD-10-CM | POA: Diagnosis not present

## 2020-03-14 DIAGNOSIS — E1159 Type 2 diabetes mellitus with other circulatory complications: Secondary | ICD-10-CM | POA: Diagnosis not present

## 2020-03-14 DIAGNOSIS — E119 Type 2 diabetes mellitus without complications: Secondary | ICD-10-CM | POA: Diagnosis not present

## 2020-03-14 DIAGNOSIS — Z79899 Other long term (current) drug therapy: Secondary | ICD-10-CM | POA: Diagnosis not present

## 2020-07-02 DIAGNOSIS — I10 Essential (primary) hypertension: Secondary | ICD-10-CM | POA: Diagnosis not present

## 2020-07-02 DIAGNOSIS — R8290 Unspecified abnormal findings in urine: Secondary | ICD-10-CM | POA: Diagnosis not present

## 2020-07-02 DIAGNOSIS — R300 Dysuria: Secondary | ICD-10-CM | POA: Diagnosis not present

## 2020-11-20 DIAGNOSIS — S86111A Strain of other muscle(s) and tendon(s) of posterior muscle group at lower leg level, right leg, initial encounter: Secondary | ICD-10-CM | POA: Diagnosis not present

## 2020-11-21 DIAGNOSIS — S86111A Strain of other muscle(s) and tendon(s) of posterior muscle group at lower leg level, right leg, initial encounter: Secondary | ICD-10-CM | POA: Diagnosis not present

## 2020-12-02 DIAGNOSIS — S86111A Strain of other muscle(s) and tendon(s) of posterior muscle group at lower leg level, right leg, initial encounter: Secondary | ICD-10-CM | POA: Diagnosis not present

## 2021-02-21 DIAGNOSIS — R300 Dysuria: Secondary | ICD-10-CM | POA: Diagnosis not present

## 2021-02-21 DIAGNOSIS — E1159 Type 2 diabetes mellitus with other circulatory complications: Secondary | ICD-10-CM | POA: Diagnosis not present

## 2021-02-21 DIAGNOSIS — Z79899 Other long term (current) drug therapy: Secondary | ICD-10-CM | POA: Diagnosis not present

## 2021-02-21 DIAGNOSIS — I152 Hypertension secondary to endocrine disorders: Secondary | ICD-10-CM | POA: Diagnosis not present

## 2021-02-21 DIAGNOSIS — E7800 Pure hypercholesterolemia, unspecified: Secondary | ICD-10-CM | POA: Diagnosis not present

## 2021-02-27 DIAGNOSIS — E7800 Pure hypercholesterolemia, unspecified: Secondary | ICD-10-CM | POA: Diagnosis not present

## 2021-02-27 DIAGNOSIS — I152 Hypertension secondary to endocrine disorders: Secondary | ICD-10-CM | POA: Diagnosis not present

## 2021-02-27 DIAGNOSIS — Z1331 Encounter for screening for depression: Secondary | ICD-10-CM | POA: Diagnosis not present

## 2021-02-27 DIAGNOSIS — E1165 Type 2 diabetes mellitus with hyperglycemia: Secondary | ICD-10-CM | POA: Diagnosis not present

## 2021-02-27 DIAGNOSIS — Z79899 Other long term (current) drug therapy: Secondary | ICD-10-CM | POA: Diagnosis not present

## 2021-02-27 DIAGNOSIS — Z0000 Encounter for general adult medical examination without abnormal findings: Secondary | ICD-10-CM | POA: Diagnosis not present

## 2021-02-27 DIAGNOSIS — Z1211 Encounter for screening for malignant neoplasm of colon: Secondary | ICD-10-CM | POA: Diagnosis not present

## 2021-02-27 DIAGNOSIS — E1159 Type 2 diabetes mellitus with other circulatory complications: Secondary | ICD-10-CM | POA: Diagnosis not present

## 2021-03-26 DIAGNOSIS — Z1211 Encounter for screening for malignant neoplasm of colon: Secondary | ICD-10-CM | POA: Diagnosis not present

## 2021-03-31 LAB — EXTERNAL GENERIC LAB PROCEDURE: COLOGUARD: NEGATIVE

## 2021-03-31 LAB — COLOGUARD: COLOGUARD: NEGATIVE

## 2021-08-26 DIAGNOSIS — E1165 Type 2 diabetes mellitus with hyperglycemia: Secondary | ICD-10-CM | POA: Diagnosis not present

## 2021-08-26 DIAGNOSIS — E7800 Pure hypercholesterolemia, unspecified: Secondary | ICD-10-CM | POA: Diagnosis not present

## 2021-08-26 DIAGNOSIS — Z79899 Other long term (current) drug therapy: Secondary | ICD-10-CM | POA: Diagnosis not present

## 2021-09-04 DIAGNOSIS — I152 Hypertension secondary to endocrine disorders: Secondary | ICD-10-CM | POA: Diagnosis not present

## 2021-09-04 DIAGNOSIS — E7800 Pure hypercholesterolemia, unspecified: Secondary | ICD-10-CM | POA: Diagnosis not present

## 2021-09-04 DIAGNOSIS — M791 Myalgia, unspecified site: Secondary | ICD-10-CM | POA: Diagnosis not present

## 2021-09-04 DIAGNOSIS — Z79899 Other long term (current) drug therapy: Secondary | ICD-10-CM | POA: Diagnosis not present

## 2021-09-04 DIAGNOSIS — E1159 Type 2 diabetes mellitus with other circulatory complications: Secondary | ICD-10-CM | POA: Diagnosis not present

## 2021-09-04 DIAGNOSIS — T466X5A Adverse effect of antihyperlipidemic and antiarteriosclerotic drugs, initial encounter: Secondary | ICD-10-CM | POA: Diagnosis not present

## 2021-10-02 DIAGNOSIS — H40023 Open angle with borderline findings, high risk, bilateral: Secondary | ICD-10-CM | POA: Diagnosis not present

## 2021-10-02 DIAGNOSIS — H524 Presbyopia: Secondary | ICD-10-CM | POA: Diagnosis not present

## 2021-10-02 DIAGNOSIS — H5213 Myopia, bilateral: Secondary | ICD-10-CM | POA: Diagnosis not present

## 2021-10-02 DIAGNOSIS — H52223 Regular astigmatism, bilateral: Secondary | ICD-10-CM | POA: Diagnosis not present

## 2022-04-30 DIAGNOSIS — I152 Hypertension secondary to endocrine disorders: Secondary | ICD-10-CM | POA: Diagnosis not present

## 2022-04-30 DIAGNOSIS — E7800 Pure hypercholesterolemia, unspecified: Secondary | ICD-10-CM | POA: Diagnosis not present

## 2022-04-30 DIAGNOSIS — Z79899 Other long term (current) drug therapy: Secondary | ICD-10-CM | POA: Diagnosis not present

## 2022-04-30 DIAGNOSIS — E1159 Type 2 diabetes mellitus with other circulatory complications: Secondary | ICD-10-CM | POA: Diagnosis not present

## 2022-05-03 ENCOUNTER — Emergency Department: Payer: PPO

## 2022-05-03 ENCOUNTER — Other Ambulatory Visit: Payer: Self-pay

## 2022-05-03 ENCOUNTER — Inpatient Hospital Stay
Admission: EM | Admit: 2022-05-03 | Discharge: 2022-05-05 | DRG: 281 | Disposition: A | Discharge status: Other Acute Inpatient Hospital | Payer: PPO | Attending: Internal Medicine | Admitting: Internal Medicine

## 2022-05-03 DIAGNOSIS — E1169 Type 2 diabetes mellitus with other specified complication: Secondary | ICD-10-CM | POA: Diagnosis not present

## 2022-05-03 DIAGNOSIS — R7989 Other specified abnormal findings of blood chemistry: Secondary | ICD-10-CM | POA: Diagnosis not present

## 2022-05-03 DIAGNOSIS — Z6830 Body mass index (BMI) 30.0-30.9, adult: Secondary | ICD-10-CM

## 2022-05-03 DIAGNOSIS — I10 Essential (primary) hypertension: Secondary | ICD-10-CM

## 2022-05-03 DIAGNOSIS — R200 Anesthesia of skin: Secondary | ICD-10-CM | POA: Diagnosis present

## 2022-05-03 DIAGNOSIS — I4510 Unspecified right bundle-branch block: Secondary | ICD-10-CM | POA: Diagnosis present

## 2022-05-03 DIAGNOSIS — Z79899 Other long term (current) drug therapy: Secondary | ICD-10-CM

## 2022-05-03 DIAGNOSIS — I214 Non-ST elevation (NSTEMI) myocardial infarction: Secondary | ICD-10-CM | POA: Diagnosis present

## 2022-05-03 DIAGNOSIS — G8929 Other chronic pain: Secondary | ICD-10-CM | POA: Diagnosis present

## 2022-05-03 DIAGNOSIS — K219 Gastro-esophageal reflux disease without esophagitis: Secondary | ICD-10-CM | POA: Diagnosis present

## 2022-05-03 DIAGNOSIS — Z833 Family history of diabetes mellitus: Secondary | ICD-10-CM | POA: Diagnosis not present

## 2022-05-03 DIAGNOSIS — E669 Obesity, unspecified: Secondary | ICD-10-CM | POA: Diagnosis present

## 2022-05-03 DIAGNOSIS — E7800 Pure hypercholesterolemia, unspecified: Secondary | ICD-10-CM | POA: Diagnosis present

## 2022-05-03 DIAGNOSIS — Z20822 Contact with and (suspected) exposure to covid-19: Secondary | ICD-10-CM | POA: Diagnosis present

## 2022-05-03 DIAGNOSIS — I249 Acute ischemic heart disease, unspecified: Secondary | ICD-10-CM | POA: Diagnosis not present

## 2022-05-03 DIAGNOSIS — Z8249 Family history of ischemic heart disease and other diseases of the circulatory system: Secondary | ICD-10-CM

## 2022-05-03 DIAGNOSIS — E1165 Type 2 diabetes mellitus with hyperglycemia: Secondary | ICD-10-CM | POA: Diagnosis not present

## 2022-05-03 DIAGNOSIS — Z885 Allergy status to narcotic agent status: Secondary | ICD-10-CM

## 2022-05-03 DIAGNOSIS — Z888 Allergy status to other drugs, medicaments and biological substances status: Secondary | ICD-10-CM

## 2022-05-03 DIAGNOSIS — I2510 Atherosclerotic heart disease of native coronary artery without angina pectoris: Secondary | ICD-10-CM | POA: Diagnosis present

## 2022-05-03 DIAGNOSIS — E871 Hypo-osmolality and hyponatremia: Secondary | ICD-10-CM | POA: Diagnosis present

## 2022-05-03 DIAGNOSIS — R911 Solitary pulmonary nodule: Secondary | ICD-10-CM | POA: Diagnosis present

## 2022-05-03 DIAGNOSIS — R002 Palpitations: Secondary | ICD-10-CM | POA: Diagnosis not present

## 2022-05-03 DIAGNOSIS — Z8616 Personal history of COVID-19: Secondary | ICD-10-CM | POA: Diagnosis not present

## 2022-05-03 DIAGNOSIS — Z7722 Contact with and (suspected) exposure to environmental tobacco smoke (acute) (chronic): Secondary | ICD-10-CM | POA: Diagnosis present

## 2022-05-03 DIAGNOSIS — R079 Chest pain, unspecified: Secondary | ICD-10-CM | POA: Diagnosis not present

## 2022-05-03 DIAGNOSIS — R739 Hyperglycemia, unspecified: Secondary | ICD-10-CM | POA: Diagnosis present

## 2022-05-03 LAB — URINALYSIS, ROUTINE W REFLEX MICROSCOPIC
Bacteria, UA: NONE SEEN
Bilirubin Urine: NEGATIVE
Glucose, UA: 50 mg/dL — AB
Ketones, ur: 5 mg/dL — AB
Nitrite: NEGATIVE
Protein, ur: NEGATIVE mg/dL
Specific Gravity, Urine: 1.012 (ref 1.005–1.030)
pH: 7 (ref 5.0–8.0)

## 2022-05-03 LAB — CBC
HCT: 38.1 % (ref 36.0–46.0)
Hemoglobin: 13.1 g/dL (ref 12.0–15.0)
MCH: 28.5 pg (ref 26.0–34.0)
MCHC: 34.4 g/dL (ref 30.0–36.0)
MCV: 83 fL (ref 80.0–100.0)
Platelets: 248 10*3/uL (ref 150–400)
RBC: 4.59 MIL/uL (ref 3.87–5.11)
RDW: 12.4 % (ref 11.5–15.5)
WBC: 6.7 10*3/uL (ref 4.0–10.5)
nRBC: 0 % (ref 0.0–0.2)

## 2022-05-03 LAB — PROTIME-INR
INR: 1 (ref 0.8–1.2)
Prothrombin Time: 13 seconds (ref 11.4–15.2)

## 2022-05-03 LAB — HEPATIC FUNCTION PANEL
ALT: 29 U/L (ref 0–44)
AST: 24 U/L (ref 15–41)
Albumin: 4.1 g/dL (ref 3.5–5.0)
Alkaline Phosphatase: 91 U/L (ref 38–126)
Bilirubin, Direct: 0.1 mg/dL (ref 0.0–0.2)
Indirect Bilirubin: 0.7 mg/dL (ref 0.3–0.9)
Total Bilirubin: 0.8 mg/dL (ref 0.3–1.2)
Total Protein: 7 g/dL (ref 6.5–8.1)

## 2022-05-03 LAB — TROPONIN I (HIGH SENSITIVITY)
Troponin I (High Sensitivity): 24 ng/L — ABNORMAL HIGH (ref <18)
Troponin I (High Sensitivity): 718 ng/L (ref <18)
Troponin I (High Sensitivity): 3781 ng/L (ref <18)
Troponin I (High Sensitivity): 3868 ng/L (ref <18)

## 2022-05-03 LAB — BASIC METABOLIC PANEL
Anion gap: 10 (ref 5–15)
BUN: 13 mg/dL (ref 8–23)
CO2: 24 mmol/L (ref 22–32)
Calcium: 8.9 mg/dL (ref 8.9–10.3)
Chloride: 94 mmol/L — ABNORMAL LOW (ref 98–111)
Creatinine, Ser: 0.67 mg/dL (ref 0.44–1.00)
GFR, Estimated: >60 mL/min (ref >60)
Glucose, Bld: 199 mg/dL — ABNORMAL HIGH (ref 70–99)
Potassium: 3.4 mmol/L — ABNORMAL LOW (ref 3.5–5.1)
Sodium: 128 mmol/L — ABNORMAL LOW (ref 135–145)

## 2022-05-03 LAB — HEPARIN LEVEL (UNFRACTIONATED): Heparin Unfractionated: 0.17 IU/mL — ABNORMAL LOW (ref 0.30–0.70)

## 2022-05-03 LAB — TSH: TSH: 1.023 u[IU]/mL (ref 0.350–4.500)

## 2022-05-03 LAB — APTT: aPTT: 29 seconds (ref 24–36)

## 2022-05-03 LAB — CBG MONITORING, ED
Glucose-Capillary: 154 mg/dL — ABNORMAL HIGH (ref 70–99)
Glucose-Capillary: 185 mg/dL — ABNORMAL HIGH (ref 70–99)

## 2022-05-03 LAB — BRAIN NATRIURETIC PEPTIDE: B Natriuretic Peptide: 82.6 pg/mL (ref 0.0–100.0)

## 2022-05-03 LAB — MAGNESIUM: Magnesium: 2 mg/dL (ref 1.7–2.4)

## 2022-05-03 LAB — SARS CORONAVIRUS 2 BY RT PCR: SARS Coronavirus 2 by RT PCR: NEGATIVE

## 2022-05-03 MED ORDER — SODIUM CHLORIDE 0.9% FLUSH
3.0000 mL | Freq: Two times a day (BID) | INTRAVENOUS | Status: DC
  Administered 2022-05-04: 3 mL via INTRAVENOUS

## 2022-05-03 MED ORDER — HEPARIN (PORCINE) 25000 UT/250ML-% IV SOLN
1100.0000 [IU]/h | INTRAVENOUS | Status: DC
Start: 2022-05-03 — End: 2022-05-06
  Administered 2022-05-03: 700 [IU]/h via INTRAVENOUS
  Administered 2022-05-04: 950 [IU]/h via INTRAVENOUS
  Administered 2022-05-05: 1100 [IU]/h via INTRAVENOUS
  Filled 2022-05-03 (×2): qty 250

## 2022-05-03 MED ORDER — IOHEXOL 350 MG/ML SOLN
75.0000 mL | Freq: Once | INTRAVENOUS | Status: AC | PRN
  Administered 2022-05-03: 75 mL via INTRAVENOUS

## 2022-05-03 MED ORDER — ACETAMINOPHEN 650 MG RE SUPP: 650.0000 mg | Freq: Four times a day (QID) | RECTAL | Status: DC | PRN

## 2022-05-03 MED ORDER — ACETAMINOPHEN 325 MG PO TABS
650.0000 mg | ORAL_TABLET | Freq: Four times a day (QID) | ORAL | Status: DC | PRN
  Filled 2022-05-03: qty 2

## 2022-05-03 MED ORDER — INSULIN ASPART 100 UNIT/ML IJ SOLN: 0.0000 [IU] | Freq: Every day | INTRAMUSCULAR | Status: DC

## 2022-05-03 MED ORDER — ONDANSETRON HCL 4 MG PO TABS: 4.0000 mg | ORAL_TABLET | Freq: Four times a day (QID) | ORAL | Status: DC | PRN

## 2022-05-03 MED ORDER — LABETALOL HCL 5 MG/ML IV SOLN: 5.0000 mg | INTRAVENOUS | Status: DC | PRN

## 2022-05-03 MED ORDER — SENNOSIDES-DOCUSATE SODIUM 8.6-50 MG PO TABS: 1.0000 | ORAL_TABLET | Freq: Every evening | ORAL | Status: DC | PRN

## 2022-05-03 MED ORDER — HEPARIN BOLUS VIA INFUSION
3500.0000 [IU] | Freq: Once | INTRAVENOUS | Status: AC
  Administered 2022-05-03: 3500 [IU] via INTRAVENOUS
  Filled 2022-05-03: qty 3500

## 2022-05-03 MED ORDER — ONDANSETRON HCL 4 MG/2ML IJ SOLN
4.0000 mg | Freq: Four times a day (QID) | INTRAMUSCULAR | Status: DC | PRN
  Administered 2022-05-05: 4 mg via INTRAVENOUS
  Filled 2022-05-03: qty 2

## 2022-05-03 MED ORDER — ATORVASTATIN CALCIUM 20 MG PO TABS
20.0000 mg | ORAL_TABLET | Freq: Every day | ORAL | Status: DC
  Administered 2022-05-03 – 2022-05-04 (×2): 20 mg via ORAL
  Filled 2022-05-03 (×2): qty 1

## 2022-05-03 MED ORDER — LORAZEPAM 2 MG/ML IJ SOLN
1.0000 mg | Freq: Once | INTRAMUSCULAR | Status: AC | PRN
  Administered 2022-05-03: 1 mg via INTRAVENOUS
  Filled 2022-05-03: qty 1

## 2022-05-03 MED ORDER — ASPIRIN 81 MG PO CHEW
324.0000 mg | CHEWABLE_TABLET | Freq: Once | ORAL | Status: AC
  Administered 2022-05-03: 324 mg via ORAL
  Filled 2022-05-03: qty 4

## 2022-05-03 MED ORDER — MORPHINE SULFATE (PF) 2 MG/ML IV SOLN: 2.0000 mg | INTRAVENOUS | Status: DC | PRN

## 2022-05-03 MED ORDER — INSULIN ASPART 100 UNIT/ML IJ SOLN
0.0000 [IU] | Freq: Three times a day (TID) | INTRAMUSCULAR | Status: DC
  Administered 2022-05-04: 3 [IU] via SUBCUTANEOUS
  Administered 2022-05-04: 5 [IU] via SUBCUTANEOUS
  Administered 2022-05-04 – 2022-05-05 (×3): 3 [IU] via SUBCUTANEOUS
  Filled 2022-05-03 (×6): qty 1

## 2022-05-03 NOTE — Assessment & Plan Note (Signed)
-   Patient takes olmesartan-hydrochlorothiazide 40-25 mg tablet as needed for high blood pressure at home - Losartan 50 mg daily, hydrochlorothiazide 12.5 mg p.o. daily** - Labetalol 5 mg IV every 3 hours as needed for SBP greater than 175, 4 doses ordered

## 2022-05-03 NOTE — H&P (Signed)
History and Physical   MARLINDA MIRANDA Gibson:[id number] DOB: 06-20-1946 DOA: 05/03/2022  PCP: Kandyce Rud, MD  Patient coming from: Home via EMS  I have personally briefly reviewed patient's old medical records in [facility] EMR.  Chief Concern: Chest discomfort  HPI: Ms. Rebekah Gibson is a 76 year old female with history of hypertension who presents emergency department for chief concerns of chest pain.  Initial vitals in the emergency department showed temperature of 98, respiration rate of 13, heart rate of 87, blood pressure 160/90, SPO2 of 100% on room air.  Serum sodium is 128, potassium 3.4, chloride 94, bicarb 24, BUN of 13, serum creatinine of 0.67, GFR greater than 60, nonfasting blood glucose 199, WBC 6.7, hemoglobin 13.1, platelets of 248.  High sensitive troponin was initial 24, and increased to 718. BNP was 82.6.  ED treatment: Aspirin 324 mg p.o. one-time dose, heparin bolus and gtt.  Ativan 1 mg IV one-time dose.  EDP consulted unassigned cardiology, Dr. Lennette Bihari states he will see the patient.  At bedside, she was able to tell me her name, age, current calendar. She was able to identify her daughter and son at bedside.  She reports that over the last 2 years she has had on and off chest discomfort.  She states that it has progressed to the point where she was unable to mow her lawn last year.  She states that she did not get it evaluated due to concerns that it is from her lungs due to secondhand smoking.  She denies swelling of her lower extremities.  She reports that chest pressure is substernal and worse with exertion.  She denies radiation to her arms or neck.  She does endorse that she has chronic neck discomfort that she states is positional and associated with laying in bed at certain positions at night.  She endorses bilateral distal finger numbness and decreased sensation.  Social history: She lives with her son, Rebekah Gibson. She denies tobacco use. She is exposed to  secondhand smoke.  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  ROS: Constitutional: no weight change, no fever ENT/Mouth: no sore throat, no rhinorrhea Eyes: no eye pain, no vision changes Cardiovascular: + chest pain, + dyspnea,  no edema, no palpitations Respiratory: no cough, no sputum, no wheezing Gastrointestinal: no nausea, no vomiting, no diarrhea, no constipation Genitourinary: no urinary incontinence, no dysuria, no hematuria Musculoskeletal: no arthralgias, no myalgias Skin: no skin lesions, no pruritus, Neuro: + weakness, no loss of consciousness, no syncope Psych: no anxiety, no depression, + decrease appetite Heme/Lymph: no bruising, no bleeding  ED Course: Discussed with emergency medicine provider, patient requiring hospitalization for chief concerns of NSTEMI.  Assessment/Plan  Principal Problem:   NSTEMI (non-ST elevated myocardial infarction) (HCC) Active Problems:   Essential hypertension   Hypercholesteremia   Hyperglycemia   Pulmonary nodule   Assessment and Plan:  * NSTEMI (non-ST elevated myocardial infarction) (HCC) - Continue heparin GTT - Cardiology has been consulted by EDP, Dr. Welton Flakes states patient will be seen - Continue n.p.o. - Complete echo ordered - Admit to progressive cardiac, observation  Pulmonary nodule - 7 mm right lower lobe groundglass nodule, incidental finding - Recommend follow-up with PCP with an initial repeat CT imaging in 6 months to confirm persistence.  If persistent would recommend an additional CT repeat in 2 years until 5 years of stability - Patient and family endorses understanding and compliance  Hyperglycemia - A1c on 04/30/2022 which resulted:  7.9 - Insulin SSI with at bedtime coverage ordered - Goal inpatient blood glucose levels 140-180  Hypercholesteremia - Lipid panel outpatient on 04/30/2022: Was read as total cholesterol 252, triglyceride 201, LDL was 174 - Fasting lipid  panel not ordered on admission as patient recently had it done - I have initiated atorvastatin 20 mg nightly and check A1c in the a.m. - Patient may benefit from high intensity statin once work-up has been completed  Essential hypertension - Patient takes olmesartan-hydrochlorothiazide 40-25 mg tablet as needed for high blood pressure at home - Losartan 50 mg daily, hydrochlorothiazide 12.5 mg p.o. daily** - Labetalol 5 mg IV every 3 hours as needed for SBP greater than 175, 4 doses ordered  Chart reviewed.   DVT prophylaxis: Heparin GGT Code Status: full code Diet: Heart healthy at this time, n.p.o. after midnight Family Communication: Updated family at bedside with patient's permission Disposition Plan: Pending clinical course Consults called: Cardiology Admission status: Progressive cardiac, observation  Past Medical History:  Diagnosis Date   EKG, abnormal    Female bladder prolapse    Frequency of urination    History of UTI    History of vertigo    Hypertension    Nocturia    Pre-diabetes    DIET CONTROLLED   Past Surgical History:  Procedure Laterality Date   ABDOMINAL HYSTERECTOMY     ANTERIOR AND POSTERIOR REPAIR N/A 03/14/2013   Procedure: ANTERIOR (CYSTOCELE) AND POSTERIOR REPAIR (RECTOCELE);  Surgeon: Martina Sinner, MD;  Location: [facility];  Service: Urology;  Laterality: N/A;   BLADDER SUSPENSION N/A 03/14/2013   Procedure: VAULT PROLAPSE WITH SLING GRAFT;  Surgeon: Martina Sinner, MD;  Location: [facility];  Service: Urology;  Laterality: N/A;   CYSTOSCOPY N/A 03/14/2013   Procedure: CYSTOSCOPY;  Surgeon: Martina Sinner, MD;  Location: [facility];  Service: Urology;  Laterality: N/A;   PUBOVAGINAL SLING N/A 03/14/2013   Procedure: Leonides Grills;  Surgeon: Martina Sinner, MD;  Location: [facility];  Service: Urology;  Laterality: N/A;  SPARC   TUBAL LIGATION     Social History:  reports that she has never smoked. She has never used smokeless tobacco. She  reports that she does not drink alcohol and does not use drugs.  Allergies  Allergen Reactions   Codeine Itching   Erythromycin Nausea And Vomiting   Nitroglycerin Other (See Comments)    Heart rate    Family History  Problem Relation Age of Onset   Heart failure Mother    Prostate cancer Father    Valvular heart disease Brother    Diabetes Maternal Grandmother    Diabetes Maternal Grandfather    Diabetes Paternal Grandmother    Diabetes Paternal Grandfather    Family history: Family history reviewed and not pertinent  Prior to Admission medications   Medication Sig Start Date End Date Taking? Authorizing Provider  olmesartan-hydrochlorothiazide (BENICAR HCT) 40-12.5 MG tablet Take 1 tablet by mouth daily.   Yes [provider]  loratadine (CLARITIN) 10 MG tablet Take 10 mg by mouth daily as needed for allergies.    [provider]   Physical Exam: Vitals:   05/03/22 1600 05/03/22 1630 05/03/22 1700 05/03/22 1730  BP: (!) 145/80 132/83 (!) 146/81 124/80  Pulse: 82 88 83 86  Resp: 17 18 13 17   Temp:      TempSrc:      SpO2: 99% 98% 99% 99%  Weight:      Height:  Constitutional: appears age-appropriate, NAD, calm, comfortable Eyes: PERRL, lids and conjunctivae normal ENMT: Mucous membranes are moist. Posterior pharynx clear of any exudate or lesions. Age-appropriate dentition. Hearing appropriate Neck: normal, supple, no masses, no thyromegaly Respiratory: clear to auscultation bilaterally, no wheezing, no crackles. Normal respiratory effort. No accessory muscle use.  Cardiovascular: Regular rate and rhythm, no murmurs / rubs / gallops. No extremity edema. 2+ pedal pulses. No carotid bruits.  Abdomen: no tenderness, no masses palpated, no hepatosplenomegaly. Bowel sounds positive.  Musculoskeletal: no clubbing / cyanosis. No joint deformity upper and lower extremities. Good ROM, no contractures, no atrophy. Normal muscle tone.  Skin: no rashes,  lesions, ulcers. No induration Neurologic: Sensation intact. Strength 5/5 in all 4.  Psychiatric: Normal judgment and insight. Alert and oriented x 3. Normal mood.   EKG: independently reviewed, showing sinus rhythm with rate of 96, QTc 527, right bundle branch block; nonspecific T wave changes and negative for NSTEMI  Chest x-ray on Admission: I personally reviewed and I agree with radiologist reading as below.  CT Angio Chest PE W and/or Wo Contrast  Result Date: 05/03/2022 CLINICAL DATA:  76 year old female with acute chest pain and weakness. EXAM: CT ANGIOGRAPHY CHEST WITH CONTRAST TECHNIQUE: Multidetector CT imaging of the chest was performed using the standard protocol during bolus administration of intravenous contrast. Multiplanar CT image reconstructions and MIPs were obtained to evaluate the vascular anatomy. RADIATION DOSE REDUCTION: This exam was performed according to the departmental dose-optimization program which includes automated exposure control, adjustment of the mA and/or kV according to patient size and/or use of iterative reconstruction technique. CONTRAST:  80mL OMNIPAQUE IOHEXOL 350 MG/ML SOLN COMPARISON:  05/03/2022 chest radiograph. 09/20/2006 abdominal CT and prior studies FINDINGS: Cardiovascular: This is a technically satisfactory study. No pulmonary emboli are identified. Mild cardiomegaly noted. Coronary artery and aortic atherosclerotic calcifications are present. No thoracic aortic aneurysm or pericardial effusion identified. Mediastinum/Nodes: No enlarged mediastinal, hilar, or axillary lymph nodes. Thyroid gland, trachea, and esophagus demonstrate no significant findings. A small hiatal hernia is noted. Lungs/Pleura: Mild dependent and basilar opacities are noted. A 7 mm RIGHT LOWER lobe ground-glass nodule (series 5: Image 67) is noted. There is no evidence of discrete mass, consolidation, pleural effusion or pneumothorax. Upper Abdomen: No acute abnormality. A stable  2.2 x 3.7 cm RIGHT adrenal mass is compatible with a benign process-either adenoma or myelolipoma. Musculoskeletal: No acute or suspicious bony abnormalities are noted. Review of the MIP images confirms the above findings. IMPRESSION: 1. No evidence of pulmonary emboli or thoracic aortic aneurysm. 2. Mild dependent and basilar opacities-favor atelectasis. 3. 7 mm RIGHT LOWER lobe ground-glass nodule. Initial follow-up with CT at 6 months is recommended to confirm persistence. If persistent, repeat CT is recommended every 2 years until 5 years of stability has been established. This recommendation follows the consensus statement: Guidelines for Management of Incidental Pulmonary Nodules Detected on CT Images: From the Fleischner Society 2017; Radiology 2017; 284:228-243. 4. Small hiatal hernia. 5. Coronary artery disease and aortic Atherosclerosis (ICD10-I70.0). Electronically Signed   By: Harmon Pier M.D.   On: 05/03/2022 14:43   DG Chest 2 View  Result Date: 05/03/2022 CLINICAL DATA:  76 year old female with chest pain and tightness. EXAM: CHEST - 2 VIEW COMPARISON:  Chest radiographs 05/08/2016 and earlier. FINDINGS: Stable mediastinal contours. No cardiomegaly. Calcified aortic atherosclerosis. Visualized tracheal air column is within normal limits. Lung volumes are within normal limits. No pneumothorax, pulmonary edema, pleural effusion or confluent pulmonary opacity. Negative visible bowel  gas. Exaggerated thoracic kyphosis. Osteopenia. No acute osseous abnormality identified. IMPRESSION: No acute cardiopulmonary abnormality. Aortic Atherosclerosis (ICD10-I70.0). Electronically Signed   By: Odessa Fleming M.D.   On: 05/03/2022 11:15    Labs on Admission: I have personally reviewed following labs  CBC: Recent Labs  Lab 05/03/22 1035  WBC 6.7  HGB 13.1  HCT 38.1  MCV 83.0  PLT 248   Basic Metabolic Panel: Recent Labs  Lab 05/03/22 1035 05/03/22 1255  NA 128*  --   K 3.4*  --   CL 94*  --   CO2  24  --   GLUCOSE 199*  --   BUN 13  --   CREATININE 0.67  --   CALCIUM 8.9  --   MG  --  2.0   GFR: Estimated Creatinine Clearance: 51.5 mL/min (by C-G formula based on SCr of 0.67 mg/dL).  Liver Function Tests: Recent Labs  Lab 05/03/22 1035  AST 24  ALT 29  ALKPHOS 91  BILITOT 0.8  PROT 7.0  ALBUMIN 4.1   Coagulation Profile: Recent Labs  Lab 05/03/22 1408  INR 1.0   Thyroid Function Tests: Recent Labs    05/03/22 1035  TSH 1.023   Urine analysis:    Component Value Date/Time   COLORURINE YELLOW (A) 05/03/2022 1035   APPEARANCEUR CLEAR (A) 05/03/2022 1035   LABSPEC 1.012 05/03/2022 1035   PHURINE 7.0 05/03/2022 1035   GLUCOSEU 50 (A) 05/03/2022 1035   HGBUR SMALL (A) 05/03/2022 1035   BILIRUBINUR NEGATIVE 05/03/2022 1035   KETONESUR 5 (A) 05/03/2022 1035   PROTEINUR NEGATIVE 05/03/2022 1035   UROBILINOGEN 0.2 12/11/2012 1433   NITRITE NEGATIVE 05/03/2022 1035   LEUKOCYTESUR TRACE (A) 05/03/2022 1035   Dr. Sedalia Muta [facility]  If 7PM-7AM, please contact overnight-coverage provider If 7AM-7PM, please contact day coverage provider [email or web address]  05/03/2022, 5:50 PM

## 2022-05-03 NOTE — Assessment & Plan Note (Signed)
-   Continue heparin GTT - Cardiology has been consulted by EDP, Dr. Welton Flakes states patient will be seen - Continue n.p.o. - Complete echo ordered - Admit to progressive cardiac, observation

## 2022-05-03 NOTE — Consult Note (Addendum)
Rebekah Gibson is a 76 y.o. female  AL:[id number]  Primary Cardiologist: Neoma Laming, MD Reason for Consultation: chest pain  HPI: Patient is a 76 year old female that presents to the emergency department today complaining of chest pain. Patient reports chest pain started last night, a pressure across her chest. States she took a benadryl to help her sleep last night. Patient reports the pain continued this morning. Patient reports having a stress test and heart catheterization over 20 years ago due to a bundle branch block. No other history of heart disease. Currently being treated for hypertension.    Review of Systems: complaining of chest pressure left side of chest   Past Medical History:  Diagnosis Date   EKG, abnormal    Female bladder prolapse    Frequency of urination    History of UTI    History of vertigo    Hypertension    Nocturia    Pre-diabetes    DIET CONTROLLED    (Not in a hospital admission)     atorvastatin  20 mg Oral QHS    Infusions:  heparin 700 Units/hr (05/03/22 1435)    Allergies  Allergen Reactions   Codeine Itching   Erythromycin Nausea And Vomiting   Nitroglycerin Other (See Comments)    Heart rate     Social History   Socioeconomic History   Marital status: Widowed    Spouse name: Not on file   Number of children: Not on file   Years of education: Not on file   Highest education level: Not on file  Occupational History   Not on file  Tobacco Use   Smoking status: Never   Smokeless tobacco: Never  Substance and Sexual Activity   Alcohol use: No   Drug use: No   Sexual activity: Not on file  Other Topics Concern   Not on file  Social History Narrative   Not on file   Social Determinants of Health   Financial Resource Strain: Not on file  Food Insecurity: Not on file  Transportation Needs: Not on file  Physical Activity: Not on file  Stress: Not on file  Social Connections: Not on file  Intimate Partner Violence:  Not on file    History reviewed. No pertinent family history.  PHYSICAL EXAM: Vitals:   05/03/22 1433 05/03/22 1500  BP: 135/75 140/72  Pulse: 80 88  Resp: 13 13  Temp:    SpO2: 96% 100%    No intake or output data in the 24 hours ending 05/03/22 1600  General:  Well appearing. No respiratory difficulty HEENT: normal Neck: supple. no JVD. Carotids 2+ bilat; no bruits. No lymphadenopathy or thryomegaly appreciated. Cor: PMI nondisplaced. Regular rate & rhythm. No rubs, gallops or murmurs. Lungs: clear Abdomen: soft, nontender, nondistended. No hepatosplenomegaly. No bruits or masses. Good bowel sounds. Extremities: no cyanosis, clubbing, rash, edema Neuro: alert & oriented x 3, cranial nerves grossly intact. moves all 4 extremities w/o difficulty. Affect pleasant.  ECG: sinus rhythm, HR 91 bpm, RBBB, LVH  Results for orders placed or performed during the hospital encounter of 05/03/22 (from the past 24 hour(s))  Basic metabolic panel     Status: Abnormal   Collection Time: 05/03/22 10:35 AM  Result Value Ref Range   Sodium 128 (L) 135 - 145 mmol/L   Potassium 3.4 (L) 3.5 - 5.1 mmol/L   Chloride 94 (L) 98 - 111 mmol/L   CO2 24 22 - 32 mmol/L  Glucose, Bld 199 (H) 70 - 99 mg/dL   BUN 13 8 - 23 mg/dL   Creatinine, Ser 8.58 0.44 - 1.00 mg/dL   Calcium 8.9 8.9 - 85.0 mg/dL   GFR, Estimated >27 >74 mL/min   Anion gap 10 5 - 15  CBC     Status: None   Collection Time: 05/03/22 10:35 AM  Result Value Ref Range   WBC 6.7 4.0 - 10.5 K/uL   RBC 4.59 3.87 - 5.11 MIL/uL   Hemoglobin 13.1 12.0 - 15.0 g/dL   HCT 12.8 78.6 - 76.7 %   MCV 83.0 80.0 - 100.0 fL   MCH 28.5 26.0 - 34.0 pg   MCHC 34.4 30.0 - 36.0 g/dL   RDW 20.9 47.0 - 96.2 %   Platelets 248 150 - 400 K/uL   nRBC 0.0 0.0 - 0.2 %  Troponin I (High Sensitivity)     Status: Abnormal   Collection Time: 05/03/22 10:35 AM  Result Value Ref Range   Troponin I (High Sensitivity) 24 (H) <18 ng/L  Hepatic function panel      Status: None   Collection Time: 05/03/22 10:35 AM  Result Value Ref Range   Total Protein 7.0 6.5 - 8.1 g/dL   Albumin 4.1 3.5 - 5.0 g/dL   AST 24 15 - 41 U/L   ALT 29 0 - 44 U/L   Alkaline Phosphatase 91 38 - 126 U/L   Total Bilirubin 0.8 0.3 - 1.2 mg/dL   Bilirubin, Direct 0.1 0.0 - 0.2 mg/dL   Indirect Bilirubin 0.7 0.3 - 0.9 mg/dL  Urinalysis, Routine w reflex microscopic Urine, Clean Catch     Status: Abnormal   Collection Time: 05/03/22 10:35 AM  Result Value Ref Range   Color, Urine YELLOW (A) YELLOW   APPearance CLEAR (A) CLEAR   Specific Gravity, Urine 1.012 1.005 - 1.030   pH 7.0 5.0 - 8.0   Glucose, UA 50 (A) NEGATIVE mg/dL   Hgb urine dipstick SMALL (A) NEGATIVE   Bilirubin Urine NEGATIVE NEGATIVE   Ketones, ur 5 (A) NEGATIVE mg/dL   Protein, ur NEGATIVE NEGATIVE mg/dL   Nitrite NEGATIVE NEGATIVE   Leukocytes,Ua TRACE (A) NEGATIVE   RBC / HPF 0-5 0 - 5 RBC/hpf   WBC, UA 0-5 0 - 5 WBC/hpf   Bacteria, UA NONE SEEN NONE SEEN   Squamous Epithelial / LPF 0-5 0 - 5   Hyaline Casts, UA PRESENT   TSH     Status: None   Collection Time: 05/03/22 10:35 AM  Result Value Ref Range   TSH 1.023 0.350 - 4.500 uIU/mL  Brain natriuretic peptide     Status: None   Collection Time: 05/03/22 10:35 AM  Result Value Ref Range   B Natriuretic Peptide 82.6 0.0 - 100.0 pg/mL  Troponin I (High Sensitivity)     Status: Abnormal   Collection Time: 05/03/22 12:53 PM  Result Value Ref Range   Troponin I (High Sensitivity) 718 (HH) <18 ng/L  Magnesium     Status: None   Collection Time: 05/03/22 12:55 PM  Result Value Ref Range   Magnesium 2.0 1.7 - 2.4 mg/dL  Protime-INR     Status: None   Collection Time: 05/03/22  2:08 PM  Result Value Ref Range   Prothrombin Time 13.0 11.4 - 15.2 seconds   INR 1.0 0.8 - 1.2  APTT     Status: None   Collection Time: 05/03/22  2:08 PM  Result Value Ref Range  aPTT 29 24 - 36 seconds   CT Angio Chest PE W and/or Wo Contrast  Result Date:  05/03/2022 CLINICAL DATA:  76 year old female with acute chest pain and weakness. EXAM: CT ANGIOGRAPHY CHEST WITH CONTRAST TECHNIQUE: Multidetector CT imaging of the chest was performed using the standard protocol during bolus administration of intravenous contrast. Multiplanar CT image reconstructions and MIPs were obtained to evaluate the vascular anatomy. RADIATION DOSE REDUCTION: This exam was performed according to the departmental dose-optimization program which includes automated exposure control, adjustment of the mA and/or kV according to patient size and/or use of iterative reconstruction technique. CONTRAST:  89mL OMNIPAQUE IOHEXOL 350 MG/ML SOLN COMPARISON:  05/03/2022 chest radiograph. 09/20/2006 abdominal CT and prior studies FINDINGS: Cardiovascular: This is a technically satisfactory study. No pulmonary emboli are identified. Mild cardiomegaly noted. Coronary artery and aortic atherosclerotic calcifications are present. No thoracic aortic aneurysm or pericardial effusion identified. Mediastinum/Nodes: No enlarged mediastinal, hilar, or axillary lymph nodes. Thyroid gland, trachea, and esophagus demonstrate no significant findings. A small hiatal hernia is noted. Lungs/Pleura: Mild dependent and basilar opacities are noted. A 7 mm RIGHT LOWER lobe ground-glass nodule (series 5: Image 67) is noted. There is no evidence of discrete mass, consolidation, pleural effusion or pneumothorax. Upper Abdomen: No acute abnormality. A stable 2.2 x 3.7 cm RIGHT adrenal mass is compatible with a benign process-either adenoma or myelolipoma. Musculoskeletal: No acute or suspicious bony abnormalities are noted. Review of the MIP images confirms the above findings. IMPRESSION: 1. No evidence of pulmonary emboli or thoracic aortic aneurysm. 2. Mild dependent and basilar opacities-favor atelectasis. 3. 7 mm RIGHT LOWER lobe ground-glass nodule. Initial follow-up with CT at 6 months is recommended to confirm persistence.  If persistent, repeat CT is recommended every 2 years until 5 years of stability has been established. This recommendation follows the consensus statement: Guidelines for Management of Incidental Pulmonary Nodules Detected on CT Images: From the Fleischner Society 2017; Radiology 2017; 284:228-243. 4. Small hiatal hernia. 5. Coronary artery disease and aortic Atherosclerosis (ICD10-I70.0). Electronically Signed   By: Harmon Pier M.D.   On: 05/03/2022 14:43   DG Chest 2 View  Result Date: 05/03/2022 CLINICAL DATA:  76 year old female with chest pain and tightness. EXAM: CHEST - 2 VIEW COMPARISON:  Chest radiographs 05/08/2016 and earlier. FINDINGS: Stable mediastinal contours. No cardiomegaly. Calcified aortic atherosclerosis. Visualized tracheal air column is within normal limits. Lung volumes are within normal limits. No pneumothorax, pulmonary edema, pleural effusion or confluent pulmonary opacity. Negative visible bowel gas. Exaggerated thoracic kyphosis. Osteopenia. No acute osseous abnormality identified. IMPRESSION: No acute cardiopulmonary abnormality. Aortic Atherosclerosis (ICD10-I70.0). Electronically Signed   By: Odessa Fleming M.D.   On: 05/03/2022 11:15     ASSESSMENT AND PLAN: Patient presented to ED today for chest pressure across her chest, now just the left side. EKG with RBBB. Troponin levels trending up. LDL 04/30/22 was 174, not currently on a statin. Diabetic. Will plan for echocardiogram and cardiac cath tomorrow morning. Discussed with patient who is agreeable and ready to proceed with cardiac catheterization Will continue to follow.   Museum/gallery conservator FNP-C

## 2022-05-03 NOTE — Assessment & Plan Note (Signed)
-   Lipid panel outpatient on 04/30/2022: Was read as total cholesterol 252, triglyceride 201, LDL was 174 - Fasting lipid panel not ordered on admission as patient recently had it done - I have initiated atorvastatin 20 mg nightly and check A1c in the a.m. - Patient may benefit from high intensity statin once work-up has been completed

## 2022-05-03 NOTE — Hospital Course (Signed)
Rebekah Gibson is a 76 year old female with history of hypertension who presents emergency department for chief concerns of chest pain.  Initial vitals in the emergency department showed temperature of 98, respiration rate of 13, heart rate of 87, blood pressure 160/90, SPO2 of 100% on room air.  Serum sodium is 128, potassium 3.4, chloride 94, bicarb 24, BUN of 13, serum creatinine of 0.67, GFR greater than 60, nonfasting blood glucose 199, WBC 6.7, hemoglobin 13.1, platelets of 248.  High sensitive troponin was initial 24, and increased to 718. BNP was 82.6.  ED treatment: Aspirin 324 mg p.o. one-time dose, heparin bolus and gtt.  Ativan 1 mg IV one-time dose.  EDP consulted unassigned cardiology, Dr. Lennette Bihari states he will see the patient.

## 2022-05-03 NOTE — ED Provider Notes (Signed)
[facility] Provider Note    Event Date/Time   First MD Initiated Contact with Patient 05/03/22 1036     (approximate)   History   Chest Pain   HPI  Rebekah Gibson is a 76 y.o. female who on review of primary care note from December 2022 has medical history of hypertension acid reflux and type 2 diabetes  Yesterday when patient went to bed she noticed a slight chest pressure.  She was able to sleep.  This morning she got up to walk the dog and began experiencing a very heavy to severe pressure across her front of her chest.  No shortness of breath no nausea or vomiting.  Does not radiate located in the mid chest but it goes away after she rests.  Additionally she reports that since having COVID a while ago, she has had some chest pressure when she exerts herself and this summer she has noticed more so when she walks she has to stop and catch her breath to allow chest pressure to go away.  However today's episode was very severe in its character  No abdominal pain.  She has had a couple loose stools over the last day which she attributes to eating a large amount of vegetables and reports is not unusual or atypical.  She suffers from chronic neck pain and reports that that is not at all unusual and at her baseline.  She has no personal history of heart disease but reports she had a cardiac catheterization at [facility] sometime in the 1990s but not really aware of the results of this distant test  She and family report that she is extremely sensitive to nitroglycerin is cause severe hypotension for her when she took it once in the past     Physical Exam   Triage Vital Signs: ED Triage Vitals  Enc Vitals Group     BP 05/03/22 1035 (!) 160/90     Pulse Rate 05/03/22 1035 90     Resp 05/03/22 1038 13     Temp --      Temp src --      SpO2 05/03/22 1035 100 %     Weight --      Height --      Head Circumference --      Peak Flow --      Pain Score --       Pain Loc --      Pain Edu? --      Excl. in GC? --     Most recent vital signs: Vitals:   05/03/22 1433 05/03/22 1500  BP: 135/75 140/72  Pulse: 80 88  Resp: 13 13  Temp:    SpO2: 96% 100%     General: Awake, no distress.  Very pleasant.  Reports her chest pressure is currently subsided but is still present over the anterior chest CV:  Good peripheral perfusion.  Normal heart tones strong radial pulses Resp:  Normal effort.  Clear bilaterally.  Speaks without distress Abd:  No distention.  Soft nontender nondistended throughout, no pain to palpation right upper quadrant. Other:  No lower extremity edema.  No venous cords or congestion   ED Results / Procedures / Treatments   Labs (all labs ordered are listed, but only abnormal results are displayed) Labs Reviewed  BASIC METABOLIC PANEL - Abnormal; Notable for the following components:      Result Value   Sodium 128 (*)    Potassium  3.4 (*)    Chloride 94 (*)    Glucose, Bld 199 (*)    All other components within normal limits  URINALYSIS, ROUTINE W REFLEX MICROSCOPIC - Abnormal; Notable for the following components:   Color, Urine YELLOW (*)    APPearance CLEAR (*)    Glucose, UA 50 (*)    Hgb urine dipstick SMALL (*)    Ketones, ur 5 (*)    Leukocytes,Ua TRACE (*)    All other components within normal limits  TROPONIN I (HIGH SENSITIVITY) - Abnormal; Notable for the following components:   Troponin I (High Sensitivity) 24 (*)    All other components within normal limits  TROPONIN I (HIGH SENSITIVITY) - Abnormal; Notable for the following components:   Troponin I (High Sensitivity) 718 (*)    All other components within normal limits  CBC  HEPATIC FUNCTION PANEL  TSH  BRAIN NATRIURETIC PEPTIDE  PROTIME-INR  APTT  HEPARIN LEVEL (UNFRACTIONATED)  MAGNESIUM     Today's EKG is interpreted as similar morphology to EKG from 2017 which was able to be reviewed in our system  RADIOLOGY   Chest x-ray interpreted  by me as negative for acute process  CT Angio Chest PE W and/or Wo Contrast  Result Date: 05/03/2022 CLINICAL DATA:  76 year old female with acute chest pain and weakness. EXAM: CT ANGIOGRAPHY CHEST WITH CONTRAST TECHNIQUE: Multidetector CT imaging of the chest was performed using the standard protocol during bolus administration of intravenous contrast. Multiplanar CT image reconstructions and MIPs were obtained to evaluate the vascular anatomy. RADIATION DOSE REDUCTION: This exam was performed according to the departmental dose-optimization program which includes automated exposure control, adjustment of the mA and/or kV according to patient size and/or use of iterative reconstruction technique. CONTRAST:  15mL OMNIPAQUE IOHEXOL 350 MG/ML SOLN COMPARISON:  05/03/2022 chest radiograph. 09/20/2006 abdominal CT and prior studies FINDINGS: Cardiovascular: This is a technically satisfactory study. No pulmonary emboli are identified. Mild cardiomegaly noted. Coronary artery and aortic atherosclerotic calcifications are present. No thoracic aortic aneurysm or pericardial effusion identified. Mediastinum/Nodes: No enlarged mediastinal, hilar, or axillary lymph nodes. Thyroid gland, trachea, and esophagus demonstrate no significant findings. A small hiatal hernia is noted. Lungs/Pleura: Mild dependent and basilar opacities are noted. A 7 mm RIGHT LOWER lobe ground-glass nodule (series 5: Image 67) is noted. There is no evidence of discrete mass, consolidation, pleural effusion or pneumothorax. Upper Abdomen: No acute abnormality. A stable 2.2 x 3.7 cm RIGHT adrenal mass is compatible with a benign process-either adenoma or myelolipoma. Musculoskeletal: No acute or suspicious bony abnormalities are noted. Review of the MIP images confirms the above findings. IMPRESSION: 1. No evidence of pulmonary emboli or thoracic aortic aneurysm. 2. Mild dependent and basilar opacities-favor atelectasis. 3. 7 mm RIGHT LOWER lobe  ground-glass nodule. Initial follow-up with CT at 6 months is recommended to confirm persistence. If persistent, repeat CT is recommended every 2 years until 5 years of stability has been established. This recommendation follows the consensus statement: Guidelines for Management of Incidental Pulmonary Nodules Detected on CT Images: From the Fleischner Society 2017; Radiology 2017; 284:228-243. 4. Small hiatal hernia. 5. Coronary artery disease and aortic Atherosclerosis (ICD10-I70.0). Electronically Signed   By: Harmon Pier M.D.   On: 05/03/2022 14:43   DG Chest 2 View  Result Date: 05/03/2022 CLINICAL DATA:  76 year old female with chest pain and tightness. EXAM: CHEST - 2 VIEW COMPARISON:  Chest radiographs 05/08/2016 and earlier. FINDINGS: Stable mediastinal contours. No cardiomegaly. Calcified aortic atherosclerosis. Visualized  tracheal air column is within normal limits. Lung volumes are within normal limits. No pneumothorax, pulmonary edema, pleural effusion or confluent pulmonary opacity. Negative visible bowel gas. Exaggerated thoracic kyphosis. Osteopenia. No acute osseous abnormality identified. IMPRESSION: No acute cardiopulmonary abnormality. Aortic Atherosclerosis (ICD10-I70.0). Electronically Signed   By: Odessa Fleming M.D.   On: 05/03/2022 11:15      PROCEDURES:  Critical Care performed: Yes, see critical care procedure note(s)  CRITICAL CARE Performed by: Sharyn Creamer   Total critical care time: 35 minutes  Critical care time was exclusive of separately billable procedures and treating other patients.  Critical care was necessary to treat or prevent imminent or life-threatening deterioration.  Critical care was time spent personally by me on the following activities: development of treatment plan with patient and/or surrogate as well as nursing, discussions with consultants, evaluation of patient's response to treatment, examination of patient, obtaining history from patient or  surrogate, ordering and performing treatments and interventions, ordering and review of laboratory studies, ordering and review of radiographic studies, pulse oximetry and re-evaluation of patient's condition.   Procedures   MEDICATIONS ORDERED IN ED: Medications  heparin ADULT infusion 100 units/mL (25000 units/294mL) (700 Units/hr Intravenous New Bag/Given 05/03/22 1435)  aspirin chewable tablet 324 mg (324 mg Oral Given 05/03/22 1134)  LORazepam (ATIVAN) injection 1 mg (1 mg Intravenous Given 05/03/22 1403)  iohexol (OMNIPAQUE) 350 MG/ML injection 75 mL (75 mLs Intravenous Contrast Given 05/03/22 1410)  heparin bolus via infusion 3,500 Units (3,500 Units Intravenous Bolus from Bag 05/03/22 1436)     IMPRESSION / MDM / ASSESSMENT AND PLAN / ED COURSE  I reviewed the triage vital signs and the nursing notes.                              Differential diagnosis includes, but is not limited to, ACS, angina, NSTEMI.  EKG without evidence of STEMI.  Patient given aspirin here, declined by EMS that she had taken ginkgo and was concerned it could cause bleeding issue.  No nitrates given history of severe hypotension with nitrate use in the past.  Currently pain and pressure in the chest have subsided except mild lingering sense of pressure midsternal.  EKG without clear changes of abnormality.  No ripping tearing or moving pain to suggest acute dissection.  Low risk for PE, acute symptomatology started around the time of a COVID infection we will send D-dimer is exclusionary for PE though deemed low risk  No associated abdominal symptoms.  Family does report that she had frequent urination yesterday, and has a history of UTIs.  Request urinalysis which we will evaluate  UA without evidence of acute infection, some trace leukocytes but no bacteria noted  Trop #2 significant elevation  Patient's presentation is most consistent with acute presentation with potential threat to life or bodily  function.  The patient is on the cardiac monitor to evaluate for evidence of arrhythmia and/or significant heart rate changes.  Clinical Course as of 05/03/22 1520  Sun May 03, 2022  1037 EKG interpreted by me at 1040 heart rate 90 QRS 140 QTc 500 normal sinus rhythm right bundle branch block, possible LVH as well.  No evidence of frank ischemia or STEMI [MQ]    Clinical Course User Index [MQ] Sharyn Creamer, MD   ----------------------------------------- 3:21 PM on 05/03/2022 ----------------------------------------- Consulted with the hospitalist, case and care discussed with Dr. Sedalia Muta Who is excepting of admission.  Cardiology  consult pending as well affirmed with Dr. Milta Deiters  FINAL CLINICAL IMPRESSION(S) / ED DIAGNOSES   Final diagnoses:  ACS (acute coronary syndrome) (HCC)  Lung nodule     Rx / DC Orders   ED Discharge Orders     None        Note:  This document was prepared using Dragon voice recognition software and may include unintentional dictation errors.   Sharyn Creamer, MD 05/03/22 [phone]

## 2022-05-03 NOTE — ED Provider Notes (Signed)
Patient reports just a very mild ongoing sense of chest pressure but does not wish for any pain medication.  Offered morphine.  Consider nitrates but patient reports severe drop in blood pressure with nitrates.  At this time she reports only very mild pressure and that the severe episode of chest pressure pressure was just around the time EMS arrived  Repeat EKG at this time demonstrates heart rate 95 QRS 140 QTc 520 Normal sinus rhythm, right bundle branch block.  Very mild nonspecific T wave abnormality.  No evidence of STEMI.  Suspect ACS at this point, awaiting CT to rule out pulmonary embolism.  Will initiate heparin.  Patient denies use of any anticoagulants and has no history of easy bleeding.  Does take ginkgo daily     Sharyn Creamer, MD 05/03/22 1349

## 2022-05-03 NOTE — Assessment & Plan Note (Signed)
-   7 mm right lower lobe groundglass nodule, incidental finding - Recommend follow-up with PCP with an initial repeat CT imaging in 6 months to confirm persistence.  If persistent would recommend an additional CT repeat in 2 years until 5 years of stability - Patient and family endorses understanding and compliance

## 2022-05-03 NOTE — ED Provider Notes (Signed)
Cardiology Dr. Welton Flakes consulted.  He advised to start heparin.  Cardiology seen will see patient in consult   Sharyn Creamer, MD 05/03/22 [phone]

## 2022-05-03 NOTE — ED Triage Notes (Signed)
Pt to ED via [facility] from home. Pt reports chest discomfort before bed last night and took a benadryl and felt better. Pt states woke up with the same chest pressure/tightness. Pt states overall feeling weak for a few days. Pt denies HA, N/V/D or vision changes. Pt currently on herbal supplements for vertigo and interacts with ASA so it was not given by EMS. Pt with hx HTN and RBBB. Pt hypertensive with BP 170/90.

## 2022-05-03 NOTE — Assessment & Plan Note (Signed)
-   A1c on 04/30/2022 which resulted: 7.9 - Insulin SSI with at bedtime coverage ordered - Goal inpatient blood glucose levels 140-180

## 2022-05-03 NOTE — Consult Note (Signed)
ANTICOAGULATION CONSULT NOTE - Initial Consult  Pharmacy Consult for Heparin Indication: chest pain/ACS  Allergies  Allergen Reactions   Codeine Itching   Erythromycin Nausea And Vomiting   Nitroglycerin Other (See Comments)    Heart rate     Patient Measurements: Height: 4\' 11"  (149.9 cm) Weight: 69.4 kg (153 lb) IBW/kg (Calculated) : 43.2 Heparin Dosing Weight: 58.6 kg  Vital Signs: Temp: 98 F (36.7 C) (07/30 1044) Temp Source: Oral (07/30 1044) BP: 146/77 (07/30 1340) Pulse Rate: 84 (07/30 1340)  Labs: Recent Labs    05/03/22 1035 05/03/22 1253 05/03/22 1408  HGB 13.1  --   --   HCT 38.1  --   --   PLT 248  --   --   APTT  --   --  29  LABPROT  --   --  13.0  INR  --   --  1.0  CREATININE 0.67  --   --   TROPONINIHS 24* 718*  --     Estimated Creatinine Clearance: 51.5 mL/min (by C-G formula based on SCr of 0.67 mg/dL).   Medical History: Past Medical History:  Diagnosis Date   EKG, abnormal    Female bladder prolapse    Frequency of urination    History of UTI    History of vertigo    Hypertension    Nocturia    Pre-diabetes    DIET CONTROLLED    Medications:  No history of chronic anticoagulant use PTA  Assessment: Pharmacy has been consulted to initiate and monitor heparin in 75yo patient presenting to ED with chest pain. EKG showing normal sinus rhythm, right bundle branch block with no evidence of NSTEMI. Per EDP, suspect ACS at this point, awaiting CT to rule out pulmonary embolism. Baseline labs are ordered and pending.  Goal of Therapy:  Heparin level 0.3-0.7 units/ml Monitor platelets by anticoagulation protocol: Yes   Plan:  Give 3500 units bolus x 1 Start heparin infusion at 700 units/hr Check anti-Xa level in 8 hours and daily while on heparin Continue to monitor H&H and platelets  Devonne Kitchen A Kyan Yurkovich 05/03/2022,2:38 PM

## 2022-05-04 ENCOUNTER — Observation Stay
Admit: 2022-05-04 | Discharge: 2022-05-04 | Disposition: A | Discharge status: Home / Self Care | Payer: PPO | Attending: Internal Medicine | Admitting: Internal Medicine

## 2022-05-04 DIAGNOSIS — E669 Obesity, unspecified: Secondary | ICD-10-CM | POA: Diagnosis present

## 2022-05-04 DIAGNOSIS — Z888 Allergy status to other drugs, medicaments and biological substances status: Secondary | ICD-10-CM | POA: Diagnosis not present

## 2022-05-04 DIAGNOSIS — E871 Hypo-osmolality and hyponatremia: Secondary | ICD-10-CM | POA: Diagnosis present

## 2022-05-04 DIAGNOSIS — Z20822 Contact with and (suspected) exposure to covid-19: Secondary | ICD-10-CM | POA: Diagnosis present

## 2022-05-04 DIAGNOSIS — E7800 Pure hypercholesterolemia, unspecified: Secondary | ICD-10-CM

## 2022-05-04 DIAGNOSIS — R911 Solitary pulmonary nodule: Secondary | ICD-10-CM | POA: Diagnosis present

## 2022-05-04 DIAGNOSIS — G8929 Other chronic pain: Secondary | ICD-10-CM | POA: Diagnosis present

## 2022-05-04 DIAGNOSIS — E1169 Type 2 diabetes mellitus with other specified complication: Secondary | ICD-10-CM | POA: Diagnosis not present

## 2022-05-04 DIAGNOSIS — I2510 Atherosclerotic heart disease of native coronary artery without angina pectoris: Secondary | ICD-10-CM | POA: Diagnosis present

## 2022-05-04 DIAGNOSIS — I214 Non-ST elevation (NSTEMI) myocardial infarction: Secondary | ICD-10-CM | POA: Diagnosis present

## 2022-05-04 DIAGNOSIS — K219 Gastro-esophageal reflux disease without esophagitis: Secondary | ICD-10-CM | POA: Diagnosis present

## 2022-05-04 DIAGNOSIS — Z6830 Body mass index (BMI) 30.0-30.9, adult: Secondary | ICD-10-CM | POA: Diagnosis not present

## 2022-05-04 DIAGNOSIS — Z8249 Family history of ischemic heart disease and other diseases of the circulatory system: Secondary | ICD-10-CM | POA: Diagnosis not present

## 2022-05-04 DIAGNOSIS — R200 Anesthesia of skin: Secondary | ICD-10-CM | POA: Diagnosis present

## 2022-05-04 DIAGNOSIS — Z79899 Other long term (current) drug therapy: Secondary | ICD-10-CM | POA: Diagnosis not present

## 2022-05-04 DIAGNOSIS — Z885 Allergy status to narcotic agent status: Secondary | ICD-10-CM | POA: Diagnosis not present

## 2022-05-04 DIAGNOSIS — Z833 Family history of diabetes mellitus: Secondary | ICD-10-CM | POA: Diagnosis not present

## 2022-05-04 DIAGNOSIS — I10 Essential (primary) hypertension: Secondary | ICD-10-CM | POA: Diagnosis present

## 2022-05-04 DIAGNOSIS — Z7722 Contact with and (suspected) exposure to environmental tobacco smoke (acute) (chronic): Secondary | ICD-10-CM | POA: Diagnosis present

## 2022-05-04 DIAGNOSIS — E1165 Type 2 diabetes mellitus with hyperglycemia: Secondary | ICD-10-CM | POA: Diagnosis not present

## 2022-05-04 DIAGNOSIS — Z8616 Personal history of COVID-19: Secondary | ICD-10-CM | POA: Diagnosis not present

## 2022-05-04 DIAGNOSIS — I4510 Unspecified right bundle-branch block: Secondary | ICD-10-CM | POA: Diagnosis present

## 2022-05-04 LAB — BASIC METABOLIC PANEL
Anion gap: 8 (ref 5–15)
BUN: 12 mg/dL (ref 8–23)
CO2: 23 mmol/L (ref 22–32)
Calcium: 8.7 mg/dL — ABNORMAL LOW (ref 8.9–10.3)
Chloride: 103 mmol/L (ref 98–111)
Creatinine, Ser: 0.57 mg/dL (ref 0.44–1.00)
GFR, Estimated: >60 mL/min (ref >60)
Glucose, Bld: 171 mg/dL — ABNORMAL HIGH (ref 70–99)
Potassium: 3.8 mmol/L (ref 3.5–5.1)
Sodium: 134 mmol/L — ABNORMAL LOW (ref 135–145)

## 2022-05-04 LAB — GLUCOSE, CAPILLARY
Glucose-Capillary: 154 mg/dL — ABNORMAL HIGH (ref 70–99)
Glucose-Capillary: 200 mg/dL — ABNORMAL HIGH (ref 70–99)

## 2022-05-04 LAB — CBG MONITORING, ED
Glucose-Capillary: 184 mg/dL — ABNORMAL HIGH (ref 70–99)
Glucose-Capillary: 219 mg/dL — ABNORMAL HIGH (ref 70–99)

## 2022-05-04 LAB — CBC
HCT: 35.9 % — ABNORMAL LOW (ref 36.0–46.0)
Hemoglobin: 12.2 g/dL (ref 12.0–15.0)
MCH: 28.7 pg (ref 26.0–34.0)
MCHC: 34 g/dL (ref 30.0–36.0)
MCV: 84.5 fL (ref 80.0–100.0)
Platelets: 218 10*3/uL (ref 150–400)
RBC: 4.25 MIL/uL (ref 3.87–5.11)
RDW: 12.4 % (ref 11.5–15.5)
WBC: 7.5 10*3/uL (ref 4.0–10.5)
nRBC: 0 % (ref 0.0–0.2)

## 2022-05-04 LAB — ECHOCARDIOGRAM COMPLETE
AR max vel: 1.83 cm2
AV Area VTI: 2.35 cm2
AV Area mean vel: 1.55 cm2
AV Mean grad: 3 mmHg
AV Peak grad: 6.4 mmHg
Ao pk vel: 1.26 m/s
Area-P 1/2: 3.74 cm2
Calc EF: 27.8 %
Height: 59 in
S' Lateral: 2.8 cm
Single Plane A2C EF: 43.2 %
Single Plane A4C EF: 16.1 %
Weight: 2448 oz

## 2022-05-04 LAB — HEPARIN LEVEL (UNFRACTIONATED)
Heparin Unfractionated: 0.34 IU/mL (ref 0.30–0.70)
Heparin Unfractionated: 0.25 IU/mL — ABNORMAL LOW (ref 0.30–0.70)

## 2022-05-04 MED ORDER — HEPARIN BOLUS VIA INFUSION
1000.0000 [IU] | Freq: Once | INTRAVENOUS | Status: AC
Start: 2022-05-04 — End: 2022-05-04
  Administered 2022-05-04: 1000 [IU] via INTRAVENOUS
  Filled 2022-05-04: qty 1000

## 2022-05-04 MED ORDER — PERFLUTREN LIPID MICROSPHERE
1.0000 mL | INTRAVENOUS | Status: AC | PRN
  Administered 2022-05-04: 2 mL via INTRAVENOUS

## 2022-05-04 MED ORDER — SODIUM CHLORIDE 0.9% FLUSH: 3.0000 mL | INTRAVENOUS | Status: DC | PRN

## 2022-05-04 MED ORDER — ASPIRIN 81 MG PO CHEW
81.0000 mg | CHEWABLE_TABLET | ORAL | Status: AC
Start: 2022-05-05 — End: 2022-05-05
  Administered 2022-05-05: 81 mg via ORAL
  Filled 2022-05-04: qty 1

## 2022-05-04 MED ORDER — HEPARIN BOLUS VIA INFUSION
1750.0000 [IU] | Freq: Once | INTRAVENOUS | Status: AC
  Administered 2022-05-04: 1750 [IU] via INTRAVENOUS
  Filled 2022-05-04: qty 1750

## 2022-05-04 MED ORDER — SODIUM CHLORIDE 0.9 % WEIGHT BASED INFUSION
1.0000 mL/kg/h | INTRAVENOUS | Status: DC
  Administered 2022-05-05: 1 mL/kg/h via INTRAVENOUS

## 2022-05-04 MED ORDER — SODIUM CHLORIDE 0.9 % IV SOLN: 250.0000 mL | INTRAVENOUS | Status: DC | PRN

## 2022-05-04 MED ORDER — SODIUM CHLORIDE 0.9 % WEIGHT BASED INFUSION
3.0000 mL/kg/h | INTRAVENOUS | Status: AC
  Administered 2022-05-04: 3 mL/kg/h via INTRAVENOUS

## 2022-05-04 NOTE — Progress Notes (Signed)
*  PRELIMINARY RESULTS* Echocardiogram 2D Echocardiogram has been performed.  Cristela Blue 05/04/2022, 11:42 AM

## 2022-05-04 NOTE — Consult Note (Signed)
ANTICOAGULATION CONSULT NOTE - Initial Consult  Pharmacy Consult for Heparin Indication: chest pain/ACS  Allergies  Allergen Reactions   Codeine Itching   Erythromycin Nausea And Vomiting   Nitroglycerin Other (See Comments)    Heart rate     Patient Measurements: Height: 4\' 11"  (149.9 cm) Weight: 69.4 kg (153 lb) IBW/kg (Calculated) : 43.2 Heparin Dosing Weight: 58.6 kg  Vital Signs: Temp: 97.8 F (36.6 C) (07/30 2000) Temp Source: Oral (07/30 2000) BP: 92/55 (07/31 0100) Pulse Rate: 75 (07/31 0100)  Labs: Recent Labs    05/03/22 1035 05/03/22 1253 05/03/22 1408 05/03/22 1938 05/03/22 2109  HGB 13.1  --   --   --   --   HCT 38.1  --   --   --   --   PLT 248  --   --   --   --   APTT  --   --  29  --   --   LABPROT  --   --  13.0  --   --   INR  --   --  1.0  --   --   HEPARINUNFRC  --   --   --   --  0.17*  CREATININE 0.67  --   --   --   --   TROPONINIHS 24* 718*  --  3,781* 3,868*     Estimated Creatinine Clearance: 51.5 mL/min (by C-G formula based on SCr of 0.67 mg/dL).   Medical History: Past Medical History:  Diagnosis Date   EKG, abnormal    Female bladder prolapse    Frequency of urination    History of UTI    History of vertigo    Hypertension    Nocturia    Pre-diabetes    DIET CONTROLLED    Medications:  No history of chronic anticoagulant use PTA  Assessment: Pharmacy has been consulted to initiate and monitor heparin in 75yo patient presenting to ED with chest pain. EKG showing normal sinus rhythm, right bundle branch block with no evidence of NSTEMI. Per EDP, suspect ACS at this point, awaiting CT to rule out pulmonary embolism. Baseline labs are ordered and pending.  Goal of Therapy:  Heparin level 0.3-0.7 units/ml Monitor platelets by anticoagulation protocol: Yes  Date Time HL Rate/Comment 7/30 2109 0.17 SUBtherapeutic /   Plan:  Give 1750 units bolus and increase heparin infusion to 950 units/hr Check anti-Xa level in 8  hours and daily while on heparin Continue to monitor H&H and platelets  2110 05/04/2022,2:16 AM

## 2022-05-04 NOTE — ED Notes (Addendum)
RN called and notified RN that a secure chat was sent to start the report process. Miranda,RN sts " I was busy with a pt and I am now stepping off the floor for 30 min."

## 2022-05-04 NOTE — Consult Note (Addendum)
ANTICOAGULATION CONSULT NOTE - Follow up consult  Pharmacy Consult for Heparin Indication: chest pain/ACS  Allergies  Allergen Reactions   Codeine Itching   Erythromycin Nausea And Vomiting   Nitroglycerin Other (See Comments)    Heart rate     Patient Measurements: Height: 4\' 11"  (149.9 cm) Weight: 69.4 kg (153 lb) IBW/kg (Calculated) : 43.2 Heparin Dosing Weight: 58.6 kg  Vital Signs: Temp: 98 F (36.7 C) (07/31 0700) Temp Source: Oral (07/31 0700) BP: 98/66 (07/31 0700) Pulse Rate: 88 (07/31 0700)  Labs: Recent Labs    05/03/22 1035 05/03/22 1253 05/03/22 1408 05/03/22 1938 05/03/22 2109 05/04/22 0520 05/04/22 0830  HGB 13.1  --   --   --   --  12.2  --   HCT 38.1  --   --   --   --  35.9*  --   PLT 248  --   --   --   --  218  --   APTT  --   --  29  --   --   --   --   LABPROT  --   --  13.0  --   --   --   --   INR  --   --  1.0  --   --   --   --   HEPARINUNFRC  --   --   --   --  0.17*  --  0.34  CREATININE 0.67  --   --   --   --  0.57  --   TROPONINIHS 24* 718*  --  3,781* 3,868*  --   --      Estimated Creatinine Clearance: 51.5 mL/min (by C-G formula based on SCr of 0.57 mg/dL).   Medical History: Past Medical History:  Diagnosis Date   EKG, abnormal    Female bladder prolapse    Frequency of urination    History of UTI    History of vertigo    Hypertension    Nocturia    Pre-diabetes    DIET CONTROLLED    Medications:  No history of chronic anticoagulant use PTA  Assessment: Pharmacy has been consulted to initiate and monitor heparin in 76yo patient presenting to ED with chest pain. EKG showing normal sinus rhythm, right bundle branch block with no evidence of NSTEMI. Per EDP, suspect ACS at this point, awaiting CT to rule out pulmonary embolism.   Goal of Therapy:  Heparin level 0.3-0.7 units/ml Monitor platelets by anticoagulation protocol: Yes  Date Time HL Rate/Comment 7/30 2109 0.17 SUBtherapeutic   7/31 0830 0.34 Therapeutic x 1  Plan:  Continue heparin infusion at 950 units/hr Check anti-Xa level in 8 hours to confirm level Continue to monitor H&H and platelets daily while on heparin  Consetta Cosner Rodriguez-Guzman PharmD, BCPS 05/04/2022 10:19 AM

## 2022-05-04 NOTE — Progress Notes (Signed)
SUBJECTIVE:  Patient is a 76 year old female that presented to the emergency department yesterday complaining of chest pain. Patient reports chest pain started 2 nights ago, a pressure across her chest. States she took a benadryl to help her sleep night. Patient reports the pain continued yesterday morning. Patient reports having a stress test and heart catheterization over 20 years ago due to a bundle branch block. No other history of heart disease. Currently being treated for hypertension.   Patient resting comfortably in bed. No new complaints.   Vitals:   05/03/22 2200 05/03/22 2300 05/04/22 0100 05/04/22 0400  BP: (!) 107/56 124/79 (!) 92/55 112/61  Pulse: 93 78 75 73  Resp: 20 16 (!) 22 17  Temp:      TempSrc:      SpO2: 98% 97% 97% 97%  Weight:      Height:       No intake or output data in the 24 hours ending 05/04/22 0838  LABS: Basic Metabolic Panel: Recent Labs    05/03/22 1035 05/03/22 1255 05/04/22 0520  NA 128*  --  134*  K 3.4*  --  3.8  CL 94*  --  103  CO2 24  --  23  GLUCOSE 199*  --  171*  BUN 13  --  12  CREATININE 0.67  --  0.57  CALCIUM 8.9  --  8.7*  MG  --  2.0  --    Liver Function Tests: Recent Labs    05/03/22 1035  AST 24  ALT 29  ALKPHOS 91  BILITOT 0.8  PROT 7.0  ALBUMIN 4.1   No results for input(s): "LIPASE", "AMYLASE" in the last 72 hours. CBC: Recent Labs    05/03/22 1035 05/04/22 0520  WBC 6.7 7.5  HGB 13.1 12.2  HCT 38.1 35.9*  MCV 83.0 84.5  PLT 248 218   Cardiac Enzymes: No results for input(s): "CKTOTAL", "CKMB", "CKMBINDEX", "TROPONINI" in the last 72 hours. BNP: Invalid input(s): "POCBNP" D-Dimer: No results for input(s): "DDIMER" in the last 72 hours. Hemoglobin A1C: No results for input(s): "HGBA1C" in the last 72 hours. Fasting Lipid Panel: No results for input(s): "CHOL", "HDL", "LDLCALC", "TRIG", "CHOLHDL", "LDLDIRECT" in the last 72 hours. Thyroid Function Tests: Recent Labs    05/03/22 1035  TSH  1.023   Anemia Panel: No results for input(s): "VITAMINB12", "FOLATE", "FERRITIN", "TIBC", "IRON", "RETICCTPCT" in the last 72 hours.   PHYSICAL EXAM General: Well developed, well nourished, in no acute distress HEENT:  Normocephalic and atramatic Neck:  No JVD.  Lungs: Clear bilaterally to auscultation and percussion. Heart: HRRR . Normal S1 and S2 without gallops or murmurs.  Abdomen: Bowel sounds are positive, abdomen soft and non-tender  Msk:  Back normal, normal gait. Normal strength and tone for age. Extremities: No clubbing, cyanosis or edema.   Neuro: Alert and oriented X 3. Psych:  Good affect, responds appropriately  TELEMETRY: NSR, HR 70 bpm  ASSESSMENT AND PLAN: Patient daughter reports slow decline of exercise tolerance over the past 2 years. EKG with RBBB. Troponin levels trending up. LDL 04/30/22 was 174, not currently on a statin. Diabetic. Will plan for echocardiogram. Cardiac cath now scheduled for tomorrow morning. Discussed with patient who is agreeable and ready to proceed with cardiac catheterization Will continue to follow.   Principal Problem:   NSTEMI (non-ST elevated myocardial infarction) [facility]) Active Problems:   Essential hypertension   Hypercholesteremia   Hyperglycemia   Pulmonary nodule  Breianna Delfino, FNP-C 05/04/2022 8:38 AM

## 2022-05-04 NOTE — Plan of Care (Signed)
  Problem: Education: Goal: Ability to describe self-care measures that may prevent or decrease complications (Diabetes Survival Skills Education) will improve Outcome: Progressing   

## 2022-05-04 NOTE — Progress Notes (Signed)
PROGRESS NOTE    Rebekah Gibson  ZOX:[id number] DOB: 04-23-1946 DOA: 05/03/2022 PCP: Kandyce Rud, MD   Assessment & Plan:   Principal Problem:   NSTEMI (non-ST elevated myocardial infarction) [facility]) Active Problems:   Essential hypertension   Hypercholesteremia   Hyperglycemia   Pulmonary nodule  Assessment and Plan: NSTEMI: w/ elevated troponins. Continue on IV heparin. Echo ordered. Morphine prn. Cardiac cath tomorrow as per cardio    Pulmonary nodule: 7 mm right lower lobe groundglass nodule, incidental finding. Will need repeat imaging in 6 months. Pt and family are aware as per admitting physician    DM2: ?new onset. HbA1c 7.9, poorly controlled. Continue on SSI w/ accuchecks    HLD: started on statin    HTN: hold home dose of olmesartan, HCTZ  Obesity: BMI 30.9. Complicates overall care & prognosis     DVT prophylaxis: IV heparin drip  Code Status: full  Family Communication: discussed pt's care w/ pt's family at bedside and answered their questions  Disposition Plan: likely d/c back home .  Level of care: Progressive  Status is: Observation The patient remains OBS appropriate and will d/c before 2 midnights.   Consultants:  Cardio   Procedures:   Antimicrobials:    Subjective: Pt c/o fatigue   Objective: Vitals:   05/03/22 2200 05/03/22 2300 05/04/22 0100 05/04/22 0400  BP: (!) 107/56 124/79 (!) 92/55 112/61  Pulse: 93 78 75 73  Resp: 20 16 (!) 22 17  Temp:      TempSrc:      SpO2: 98% 97% 97% 97%  Weight:      Height:       No intake or output data in the 24 hours ending 05/04/22 0843 Filed Weights   05/03/22 1343  Weight: 69.4 kg    Examination:  General exam: Appears calm and comfortable  Respiratory system: Clear to auscultation. Respiratory effort normal. Cardiovascular system: S1 & S2 +. No rubs, gallops or clicks.  Gastrointestinal system: Abdomen is nondistended, soft and nontender.  Normal bowel sounds heard. Central  nervous system: Alert and oriented. Moves all extremities  Psychiatry: Judgement and insight appear normal. Mood & affect appropriate.     Data Reviewed: I have personally reviewed following labs and imaging studies  CBC: Recent Labs  Lab 05/03/22 1035 05/04/22 0520  WBC 6.7 7.5  HGB 13.1 12.2  HCT 38.1 35.9*  MCV 83.0 84.5  PLT 248 218   Basic Metabolic Panel: Recent Labs  Lab 05/03/22 1035 05/03/22 1255 05/04/22 0520  NA 128*  --  134*  K 3.4*  --  3.8  CL 94*  --  103  CO2 24  --  23  GLUCOSE 199*  --  171*  BUN 13  --  12  CREATININE 0.67  --  0.57  CALCIUM 8.9  --  8.7*  MG  --  2.0  --    GFR: Estimated Creatinine Clearance: 51.5 mL/min (by C-G formula based on SCr of 0.57 mg/dL). Liver Function Tests: Recent Labs  Lab 05/03/22 1035  AST 24  ALT 29  ALKPHOS 91  BILITOT 0.8  PROT 7.0  ALBUMIN 4.1   No results for input(s): "LIPASE", "AMYLASE" in the last 168 hours. No results for input(s): "AMMONIA" in the last 168 hours. Coagulation Profile: Recent Labs  Lab 05/03/22 1408  INR 1.0   Cardiac Enzymes: No results for input(s): "CKTOTAL", "CKMB", "CKMBINDEX", "TROPONINI" in the last 168 hours. BNP (last 3 results) No results for  input(s): "PROBNP" in the last 8760 hours. HbA1C: No results for input(s): "HGBA1C" in the last 72 hours. CBG: Recent Labs  Lab 05/03/22 1705 05/03/22 2153  GLUCAP 154* 185*   Lipid Profile: No results for input(s): "CHOL", "HDL", "LDLCALC", "TRIG", "CHOLHDL", "LDLDIRECT" in the last 72 hours. Thyroid Function Tests: Recent Labs    05/03/22 1035  TSH 1.023   Anemia Panel: No results for input(s): "VITAMINB12", "FOLATE", "FERRITIN", "TIBC", "IRON", "RETICCTPCT" in the last 72 hours. Sepsis Labs: No results for input(s): "PROCALCITON", "LATICACIDVEN" in the last 168 hours.  Recent Results (from the past 240 hour(s))  SARS Coronavirus 2 by RT PCR (hospital order, performed in [facility] hospital lab) *cepheid  single result test* Anterior Nasal Swab     Status: None   Collection Time: 05/03/22  9:09 PM   Specimen: Anterior Nasal Swab  Result Value Ref Range Status   SARS Coronavirus 2 by RT PCR NEGATIVE NEGATIVE Final    Comment: (NOTE) SARS-CoV-2 target nucleic acids are NOT DETECTED.  The SARS-CoV-2 RNA is generally detectable in upper and lower respiratory specimens during the acute phase of infection. The lowest concentration of SARS-CoV-2 viral copies this assay can detect is 250 copies / mL. A negative result does not preclude SARS-CoV-2 infection and should not be used as the sole basis for treatment or other patient management decisions.  A negative result may occur with improper specimen collection / handling, submission of specimen other than nasopharyngeal swab, presence of viral mutation(s) within the areas targeted by this assay, and inadequate number of viral copies (<250 copies / mL). A negative result must be combined with clinical observations, patient history, and epidemiological information.  Fact Sheet for Patients:   RoadLapTop.co.za  Fact Sheet for Healthcare Providers: http://kim-miller.com/  This test is not yet approved or  cleared by the [place] FDA and has been authorized for detection and/or diagnosis of SARS-CoV-2 by FDA under an Emergency Use Authorization (EUA).  This EUA will remain in effect (meaning this test can be used) for the duration of the COVID-19 declaration under Section 564(b)(1) of the Act, 21 U.S.C. section 360bbb-3(b)(1), unless the authorization is terminated or revoked sooner.  Performed at [facility], [address] [place], [place] [address]          Radiology Studies: CT Angio Chest PE W and/or Wo Contrast  Result Date: 05/03/2022 CLINICAL DATA:  76 year old female with acute chest pain and weakness. EXAM: CT ANGIOGRAPHY CHEST WITH CONTRAST TECHNIQUE:  Multidetector CT imaging of the chest was performed using the standard protocol during bolus administration of intravenous contrast. Multiplanar CT image reconstructions and MIPs were obtained to evaluate the vascular anatomy. RADIATION DOSE REDUCTION: This exam was performed according to the departmental dose-optimization program which includes automated exposure control, adjustment of the mA and/or kV according to patient size and/or use of iterative reconstruction technique. CONTRAST:  80mL OMNIPAQUE IOHEXOL 350 MG/ML SOLN COMPARISON:  05/03/2022 chest radiograph. 09/20/2006 abdominal CT and prior studies FINDINGS: Cardiovascular: This is a technically satisfactory study. No pulmonary emboli are identified. Mild cardiomegaly noted. Coronary artery and aortic atherosclerotic calcifications are present. No thoracic aortic aneurysm or pericardial effusion identified. Mediastinum/Nodes: No enlarged mediastinal, hilar, or axillary lymph nodes. Thyroid gland, trachea, and esophagus demonstrate no significant findings. A small hiatal hernia is noted. Lungs/Pleura: Mild dependent and basilar opacities are noted. A 7 mm RIGHT LOWER lobe ground-glass nodule (series 5: Image 67) is noted. There is no evidence of discrete mass, consolidation, pleural  effusion or pneumothorax. Upper Abdomen: No acute abnormality. A stable 2.2 x 3.7 cm RIGHT adrenal mass is compatible with a benign process-either adenoma or myelolipoma. Musculoskeletal: No acute or suspicious bony abnormalities are noted. Review of the MIP images confirms the above findings. IMPRESSION: 1. No evidence of pulmonary emboli or thoracic aortic aneurysm. 2. Mild dependent and basilar opacities-favor atelectasis. 3. 7 mm RIGHT LOWER lobe ground-glass nodule. Initial follow-up with CT at 6 months is recommended to confirm persistence. If persistent, repeat CT is recommended every 2 years until 5 years of stability has been established. This recommendation follows the  consensus statement: Guidelines for Management of Incidental Pulmonary Nodules Detected on CT Images: From the Fleischner Society 2017; Radiology 2017; 284:228-243. 4. Small hiatal hernia. 5. Coronary artery disease and aortic Atherosclerosis (ICD10-I70.0). Electronically Signed   By: Margarette Canada M.D.   On: 05/03/2022 14:43   DG Chest 2 View  Result Date: 05/03/2022 CLINICAL DATA:  76 year old female with chest pain and tightness. EXAM: CHEST - 2 VIEW COMPARISON:  Chest radiographs 05/08/2016 and earlier. FINDINGS: Stable mediastinal contours. No cardiomegaly. Calcified aortic atherosclerosis. Visualized tracheal air column is within normal limits. Lung volumes are within normal limits. No pneumothorax, pulmonary edema, pleural effusion or confluent pulmonary opacity. Negative visible bowel gas. Exaggerated thoracic kyphosis. Osteopenia. No acute osseous abnormality identified. IMPRESSION: No acute cardiopulmonary abnormality. Aortic Atherosclerosis (ICD10-I70.0). Electronically Signed   By: Genevie Ann M.D.   On: 05/03/2022 11:15        Scheduled Meds:  atorvastatin  20 mg Oral QHS   insulin aspart  0-15 Units Subcutaneous TID WC   insulin aspart  0-5 Units Subcutaneous QHS   sodium chloride flush  3 mL Intravenous Q12H   Continuous Infusions:  heparin 950 Units/hr (05/04/22 0233)     LOS: 0 days    Time spent: 35 mins     Wyvonnia Dusky, MD [facility] Pager [phone]  If 7PM-7AM, please contact night-coverage [email or web address] 05/04/2022, 8:43 AM

## 2022-05-04 NOTE — Consult Note (Signed)
ANTICOAGULATION CONSULT NOTE - Follow up consult  Pharmacy Consult for Heparin Indication: chest pain/ACS  Allergies  Allergen Reactions   Codeine Itching   Erythromycin Nausea And Vomiting   Nitroglycerin Other (See Comments)    Heart rate     Patient Measurements: Height: 4\' 11"  (149.9 cm) Weight: 69.4 kg (153 lb) IBW/kg (Calculated) : 43.2 Heparin Dosing Weight: 58.6 kg  Vital Signs: Temp: 97.7 F (36.5 C) (07/31 1508) Temp Source: Oral (07/31 1508) BP: 127/75 (07/31 1508) Pulse Rate: 84 (07/31 1508)  Labs: Recent Labs    05/03/22 1035 05/03/22 1253 05/03/22 1408 05/03/22 1938 05/03/22 2109 05/04/22 0520 05/04/22 0830 05/04/22 1534  HGB 13.1  --   --   --   --  12.2  --   --   HCT 38.1  --   --   --   --  35.9*  --   --   PLT 248  --   --   --   --  218  --   --   APTT  --   --  29  --   --   --   --   --   LABPROT  --   --  13.0  --   --   --   --   --   INR  --   --  1.0  --   --   --   --   --   HEPARINUNFRC  --   --   --   --  0.17*  --  0.34 0.25*  CREATININE 0.67  --   --   --   --  0.57  --   --   TROPONINIHS 24* 718*  --  3,781* 3,868*  --   --   --      Estimated Creatinine Clearance: 51.5 mL/min (by C-G formula based on SCr of 0.57 mg/dL).   Medical History: Past Medical History:  Diagnosis Date   EKG, abnormal    Female bladder prolapse    Frequency of urination    History of UTI    History of vertigo    Hypertension    Nocturia    Pre-diabetes    DIET CONTROLLED    Medications:  No history of chronic anticoagulant use PTA  Assessment: Pharmacy has been consulted to initiate and monitor heparin in 76yo patient presenting to ED with chest pain. EKG showing normal sinus rhythm, right bundle branch block with no evidence of NSTEMI. Per EDP, suspect ACS at this point, awaiting CT to rule out pulmonary embolism.   Goal of Therapy:  Heparin level 0.3-0.7 units/ml Monitor platelets by anticoagulation protocol:  Yes  Date Time HL Rate/Comment 7/30 2109 0.17 SUBtherapeutic  7/31 0830 0.34 Therapeutic x 1 7/31     1534    0.25     SUBtherapeutic  Plan:  Heparin 1000 units IV bolus Increase heparin infusion to 1100 units/hr Check anti-Xa level in 8 hours Continue to monitor H&H and platelets daily while on heparin  8/31, PharmD, BCPS 05/04/2022 5:25 PM

## 2022-05-04 NOTE — ED Notes (Signed)
Informed RN bed assigned 

## 2022-05-04 NOTE — ED Notes (Signed)
Pt given meal tray.

## 2022-05-04 NOTE — ED Notes (Signed)
Daughter at bedside. Pt assisted by daughter with hygiene.

## 2022-05-04 NOTE — Progress Notes (Signed)
Admission profile updated. ?

## 2022-05-04 NOTE — ED Notes (Signed)
Pt assisted to ambulate to bathroom. Pt with stead gait.

## 2022-05-05 ENCOUNTER — Encounter: Payer: Self-pay | Admitting: Cardiovascular Disease

## 2022-05-05 ENCOUNTER — Encounter
Admission: EM | Disposition: A | Discharge status: Other Acute Inpatient Hospital | Payer: Self-pay | Source: Home / Self Care | Attending: Internal Medicine

## 2022-05-05 ENCOUNTER — Ambulatory Visit (HOSPITAL_COMMUNITY)
Admission: AD | Admit: 2022-05-05 | Discharge: 2022-05-05 | Disposition: A | Discharge status: Home / Self Care | Payer: PPO | Source: Other Acute Inpatient Hospital | Attending: Internal Medicine | Admitting: Internal Medicine

## 2022-05-05 ENCOUNTER — Inpatient Hospital Stay (HOSPITAL_COMMUNITY): Admit: 2022-05-05 | Payer: PPO

## 2022-05-05 DIAGNOSIS — I4510 Unspecified right bundle-branch block: Secondary | ICD-10-CM | POA: Diagnosis not present

## 2022-05-05 DIAGNOSIS — I10 Essential (primary) hypertension: Secondary | ICD-10-CM | POA: Diagnosis not present

## 2022-05-05 DIAGNOSIS — I25110 Atherosclerotic heart disease of native coronary artery with unstable angina pectoris: Secondary | ICD-10-CM | POA: Diagnosis not present

## 2022-05-05 DIAGNOSIS — E1169 Type 2 diabetes mellitus with other specified complication: Secondary | ICD-10-CM | POA: Diagnosis not present

## 2022-05-05 DIAGNOSIS — I110 Hypertensive heart disease with heart failure: Secondary | ICD-10-CM | POA: Diagnosis not present

## 2022-05-05 DIAGNOSIS — I452 Bifascicular block: Secondary | ICD-10-CM | POA: Diagnosis not present

## 2022-05-05 DIAGNOSIS — R9431 Abnormal electrocardiogram [ECG] [EKG]: Secondary | ICD-10-CM | POA: Diagnosis not present

## 2022-05-05 DIAGNOSIS — I2510 Atherosclerotic heart disease of native coronary artery without angina pectoris: Secondary | ICD-10-CM | POA: Insufficient documentation

## 2022-05-05 DIAGNOSIS — E119 Type 2 diabetes mellitus without complications: Secondary | ICD-10-CM | POA: Diagnosis not present

## 2022-05-05 DIAGNOSIS — I5020 Unspecified systolic (congestive) heart failure: Secondary | ICD-10-CM | POA: Diagnosis not present

## 2022-05-05 DIAGNOSIS — E7800 Pure hypercholesterolemia, unspecified: Secondary | ICD-10-CM | POA: Diagnosis not present

## 2022-05-05 DIAGNOSIS — Z66 Do not resuscitate: Secondary | ICD-10-CM | POA: Diagnosis not present

## 2022-05-05 DIAGNOSIS — R911 Solitary pulmonary nodule: Secondary | ICD-10-CM | POA: Diagnosis not present

## 2022-05-05 DIAGNOSIS — E785 Hyperlipidemia, unspecified: Secondary | ICD-10-CM | POA: Diagnosis not present

## 2022-05-05 DIAGNOSIS — I998 Other disorder of circulatory system: Secondary | ICD-10-CM | POA: Diagnosis not present

## 2022-05-05 DIAGNOSIS — I214 Non-ST elevation (NSTEMI) myocardial infarction: Secondary | ICD-10-CM | POA: Diagnosis not present

## 2022-05-05 DIAGNOSIS — R079 Chest pain, unspecified: Secondary | ICD-10-CM | POA: Diagnosis not present

## 2022-05-05 DIAGNOSIS — R7989 Other specified abnormal findings of blood chemistry: Secondary | ICD-10-CM | POA: Diagnosis not present

## 2022-05-05 DIAGNOSIS — I444 Left anterior fascicular block: Secondary | ICD-10-CM | POA: Diagnosis not present

## 2022-05-05 HISTORY — PX: LEFT HEART CATH AND CORONARY ANGIOGRAPHY: CATH118249

## 2022-05-05 LAB — CBC
HCT: 35 % — ABNORMAL LOW (ref 36.0–46.0)
Hemoglobin: 12 g/dL (ref 12.0–15.0)
MCH: 28.6 pg (ref 26.0–34.0)
MCHC: 34.3 g/dL (ref 30.0–36.0)
MCV: 83.5 fL (ref 80.0–100.0)
Platelets: 224 10*3/uL (ref 150–400)
RBC: 4.19 MIL/uL (ref 3.87–5.11)
RDW: 12.5 % (ref 11.5–15.5)
WBC: 8.1 10*3/uL (ref 4.0–10.5)
nRBC: 0 % (ref 0.0–0.2)

## 2022-05-05 LAB — BASIC METABOLIC PANEL
Anion gap: 10 (ref 5–15)
BUN: 15 mg/dL (ref 8–23)
CO2: 22 mmol/L (ref 22–32)
Calcium: 8.3 mg/dL — ABNORMAL LOW (ref 8.9–10.3)
Chloride: 102 mmol/L (ref 98–111)
Creatinine, Ser: 0.59 mg/dL (ref 0.44–1.00)
GFR, Estimated: >60 mL/min (ref >60)
Glucose, Bld: 162 mg/dL — ABNORMAL HIGH (ref 70–99)
Potassium: 3.5 mmol/L (ref 3.5–5.1)
Sodium: 134 mmol/L — ABNORMAL LOW (ref 135–145)

## 2022-05-05 LAB — HEPARIN LEVEL (UNFRACTIONATED)
Heparin Unfractionated: 0.43 IU/mL (ref 0.30–0.70)
Heparin Unfractionated: <0.1 IU/mL — ABNORMAL LOW (ref 0.30–0.70)

## 2022-05-05 LAB — GLUCOSE, CAPILLARY
Glucose-Capillary: 175 mg/dL — ABNORMAL HIGH (ref 70–99)
Glucose-Capillary: 126 mg/dL — ABNORMAL HIGH (ref 70–99)
Glucose-Capillary: 183 mg/dL — ABNORMAL HIGH (ref 70–99)
Glucose-Capillary: 158 mg/dL — ABNORMAL HIGH (ref 70–99)

## 2022-05-05 SURGERY — LEFT HEART CATH AND CORONARY ANGIOGRAPHY
Anesthesia: Moderate Sedation

## 2022-05-05 MED ORDER — ACETAMINOPHEN 325 MG PO TABS
650.0000 mg | ORAL_TABLET | ORAL | Status: DC | PRN
  Administered 2022-05-05: 650 mg via ORAL

## 2022-05-05 MED ORDER — SODIUM CHLORIDE 0.9% FLUSH: 3.0000 mL | Freq: Two times a day (BID) | INTRAVENOUS | Status: DC

## 2022-05-05 MED ORDER — LIDOCAINE HCL 1 % IJ SOLN
INTRAMUSCULAR | Status: AC
  Filled 2022-05-05: qty 20

## 2022-05-05 MED ORDER — INSULIN ASPART 100 UNIT/ML IJ SOLN
0.0000 [IU] | Freq: Three times a day (TID) | INTRAMUSCULAR | Status: AC
Start: 2022-05-05 — End: ?

## 2022-05-05 MED ORDER — MIDAZOLAM HCL 2 MG/2ML IJ SOLN
INTRAMUSCULAR | Status: AC
  Filled 2022-05-05: qty 2

## 2022-05-05 MED ORDER — HEPARIN SODIUM (PORCINE) 1000 UNIT/ML IJ SOLN
INTRAMUSCULAR | Status: AC
  Filled 2022-05-05: qty 10

## 2022-05-05 MED ORDER — ATORVASTATIN CALCIUM 20 MG PO TABS: 20.0000 mg | ORAL_TABLET | Freq: Every day | ORAL | Status: AC

## 2022-05-05 MED ORDER — HYDRALAZINE HCL 20 MG/ML IJ SOLN: 10.0000 mg | INTRAMUSCULAR | Status: AC | PRN

## 2022-05-05 MED ORDER — MORPHINE SULFATE (PF) 2 MG/ML IV SOLN: 2.0000 mg | INTRAVENOUS | 0 refills | Status: AC | PRN

## 2022-05-05 MED ORDER — HEPARIN (PORCINE) IN NACL 1000-0.9 UT/500ML-% IV SOLN
INTRAVENOUS | Status: AC
  Filled 2022-05-05: qty 1000

## 2022-05-05 MED ORDER — SODIUM CHLORIDE 0.9 % IV SOLN: 250.0000 mL | INTRAVENOUS | Status: DC | PRN

## 2022-05-05 MED ORDER — HEPARIN (PORCINE) 25000 UT/250ML-% IV SOLN: 1100.0000 [IU]/h | INTRAVENOUS | Status: AC

## 2022-05-05 MED ORDER — LABETALOL HCL 5 MG/ML IV SOLN: 10.0000 mg | INTRAVENOUS | Status: AC | PRN

## 2022-05-05 MED ORDER — ONDANSETRON HCL 4 MG/2ML IJ SOLN: 4.0000 mg | Freq: Four times a day (QID) | INTRAMUSCULAR | Status: DC | PRN

## 2022-05-05 MED ORDER — HEPARIN (PORCINE) IN NACL 1000-0.9 UT/500ML-% IV SOLN
INTRAVENOUS | Status: DC | PRN
  Administered 2022-05-05 (×2): 500 mL

## 2022-05-05 MED ORDER — SODIUM CHLORIDE 0.9% FLUSH: 3.0000 mL | INTRAVENOUS | Status: DC | PRN

## 2022-05-05 MED ORDER — VERAPAMIL HCL 2.5 MG/ML IV SOLN
INTRAVENOUS | Status: AC
  Filled 2022-05-05: qty 2

## 2022-05-05 MED ORDER — FENTANYL CITRATE (PF) 100 MCG/2ML IJ SOLN
INTRAMUSCULAR | Status: DC | PRN
  Administered 2022-05-05: 50 ug via INTRAVENOUS

## 2022-05-05 MED ORDER — SODIUM CHLORIDE 0.9 % WEIGHT BASED INFUSION
1.0000 mL/kg/h | INTRAVENOUS | Status: DC
  Administered 2022-05-05: 1 mL/kg/h via INTRAVENOUS

## 2022-05-05 MED ORDER — MIDAZOLAM HCL 2 MG/2ML IJ SOLN
INTRAMUSCULAR | Status: DC | PRN
  Administered 2022-05-05: 1 mg via INTRAVENOUS

## 2022-05-05 MED ORDER — IOHEXOL 300 MG/ML  SOLN
INTRAMUSCULAR | Status: DC | PRN
  Administered 2022-05-05: 85 mL

## 2022-05-05 MED ORDER — LIDOCAINE HCL (PF) 1 % IJ SOLN
INTRAMUSCULAR | Status: DC | PRN
  Administered 2022-05-05: 20 mL

## 2022-05-05 MED ORDER — FENTANYL CITRATE (PF) 100 MCG/2ML IJ SOLN
INTRAMUSCULAR | Status: AC
  Filled 2022-05-05: qty 2

## 2022-05-05 SURGICAL SUPPLY — 10 items
CATH INFINITI 5FR MULTPACK ANG (CATHETERS) ×1 IMPLANT
DEVICE CLOSURE MYNXGRIP 5F (Vascular Products) ×1 IMPLANT
NDL PERC 18GX7CM (NEEDLE) IMPLANT
NEEDLE PERC 18GX7CM (NEEDLE) ×2 IMPLANT
PACK CARDIAC CATH (CUSTOM PROCEDURE TRAY) ×2 IMPLANT
PROTECTION STATION PRESSURIZED (MISCELLANEOUS) ×2
SET ATX SIMPLICITY (MISCELLANEOUS) ×1 IMPLANT
SHEATH AVANTI 5FR X 11CM (SHEATH) ×1 IMPLANT
STATION PROTECTION PRESSURIZED (MISCELLANEOUS) IMPLANT
WIRE GUIDERIGHT .035X150 (WIRE) ×1 IMPLANT

## 2022-05-05 NOTE — Progress Notes (Signed)
SUBJECTIVE: Patient had chest pain last night but has no chest pain at this time   Vitals:   05/05/22 1034 05/05/22 1039 05/05/22 1044 05/05/22 1049  BP: 125/81 127/70 137/86 (!) 144/85  Pulse: 82 93 97 89  Resp: 17 18 16 15   Temp:      TempSrc:      SpO2: 96% 98% 98% 97%  Weight:      Height:       No intake or output data in the 24 hours ending 05/05/22 1055  LABS: Basic Metabolic Panel: Recent Labs    05/03/22 1255 05/04/22 0520 05/05/22 0159  NA  --  134* 134*  K  --  3.8 3.5  CL  --  103 102  CO2  --  23 22  GLUCOSE  --  171* 162*  BUN  --  12 15  CREATININE  --  0.57 0.59  CALCIUM  --  8.7* 8.3*  MG 2.0  --   --    Liver Function Tests: Recent Labs    05/03/22 1035  AST 24  ALT 29  ALKPHOS 91  BILITOT 0.8  PROT 7.0  ALBUMIN 4.1   No results for input(s): "LIPASE", "AMYLASE" in the last 72 hours. CBC: Recent Labs    05/04/22 0520 05/05/22 0159  WBC 7.5 8.1  HGB 12.2 12.0  HCT 35.9* 35.0*  MCV 84.5 83.5  PLT 218 224   Cardiac Enzymes: No results for input(s): "CKTOTAL", "CKMB", "CKMBINDEX", "TROPONINI" in the last 72 hours. BNP: Invalid input(s): "POCBNP" D-Dimer: No results for input(s): "DDIMER" in the last 72 hours. Hemoglobin A1C: No results for input(s): "HGBA1C" in the last 72 hours. Fasting Lipid Panel: No results for input(s): "CHOL", "HDL", "LDLCALC", "TRIG", "CHOLHDL", "LDLDIRECT" in the last 72 hours. Thyroid Function Tests: Recent Labs    05/03/22 1035  TSH 1.023   Anemia Panel: No results for input(s): "VITAMINB12", "FOLATE", "FERRITIN", "TIBC", "IRON", "RETICCTPCT" in the last 72 hours.   PHYSICAL EXAM General: Well developed, well nourished, in no acute distress HEENT:  Normocephalic and atramatic Neck:  No JVD.  Lungs: Clear bilaterally to auscultation and percussion. Heart: HRRR . Normal S1 and S2 without gallops or murmurs.  Abdomen: Bowel sounds are positive, abdomen soft and non-tender  Msk:  Back normal,  normal gait. Normal strength and tone for age. Extremities: No clubbing, cyanosis or edema.   Neuro: Alert and oriented X 3. Psych:  Good affect, responds appropriately  TELEMETRY: Normal sinus rhythm  ASSESSMENT AND PLAN: Non-STEMI with three-vessel severe coronary artery disease and moderate LV dysfunction.  Patient has ostial LAD proximal high-grade left circumflex and occluded mid RCA with collaterals from left circumflex and LV ejection fraction 40%.  I spoke to [facility] cardiothoracic surgeon and he has accepted the patient.  Patient will be transferred for CABG.  Advise restarting the heparin after 2 hours.CT surgery [phone]  Principal Problem:   NSTEMI (non-ST elevated myocardial infarction) [facility]) Active Problems:   Essential hypertension   Hypercholesteremia   Hyperglycemia   Pulmonary nodule    [facility], MD, [facility] 05/05/2022 10:55 AM

## 2022-05-05 NOTE — Progress Notes (Signed)
Patient has left the unit. Heparin going at 11 and NS at 69.4. Rich Banker) given report and patient transported. Vital signs taken. Patient assessed (stable).

## 2022-05-05 NOTE — Progress Notes (Signed)
[facility] present to pick patient up for [facility]. Patient is currently complaining of nausea and nurse will administer Zofran. Patient's vitals have been taken (see Flowsheets)

## 2022-05-05 NOTE — Progress Notes (Signed)
Patient returned from procedure. Hearing aids are in place bilaterally. Glasses given back to patient. Family at bedside.

## 2022-05-05 NOTE — Progress Notes (Signed)
Patient arrived to SR bay 3 alert and oriented. Patient is very HOH and has bilateral hearing aids in place. She also has her glasses on. Hearing aids will remain in place for procedure; glasses will remain at bedside.

## 2022-05-05 NOTE — Consult Note (Signed)
ANTICOAGULATION CONSULT NOTE - Follow up consult  Pharmacy Consult for Heparin Indication: chest pain/ACS  Allergies  Allergen Reactions   Codeine Itching   Erythromycin Nausea And Vomiting   Nitroglycerin Other (See Comments)    Heart rate     Patient Measurements: Height: 4\' 11"  (149.9 cm) Weight: 69.4 kg (153 lb) IBW/kg (Calculated) : 43.2 Heparin Dosing Weight: 58.6 kg  Vital Signs: Temp: 97.6 F (36.4 C) (07/31 2355) Temp Source: Oral (07/31 1508) BP: 101/52 (07/31 2355) Pulse Rate: 83 (07/31 2355)  Labs: Recent Labs    05/03/22 1035 05/03/22 1035 05/03/22 1253 05/03/22 1408 05/03/22 1938 05/03/22 2109 05/04/22 0520 05/04/22 0830 05/04/22 1534 05/05/22 0159  HGB 13.1  --   --   --   --   --  12.2  --   --  12.0  HCT 38.1  --   --   --   --   --  35.9*  --   --  35.0*  PLT 248  --   --   --   --   --  218  --   --  224  APTT  --   --   --  29  --   --   --   --   --   --   LABPROT  --   --   --  13.0  --   --   --   --   --   --   INR  --   --   --  1.0  --   --   --   --   --   --   HEPARINUNFRC  --    < >  --   --   --  0.17*  --  0.34 0.25* 0.43  CREATININE 0.67  --   --   --   --   --  0.57  --   --  0.59  TROPONINIHS 24*  --  718*  --  3,781* 3,868*  --   --   --   --    < > = values in this interval not displayed.     Estimated Creatinine Clearance: 51.5 mL/min (by C-G formula based on SCr of 0.59 mg/dL).   Medical History: Past Medical History:  Diagnosis Date   EKG, abnormal    Female bladder prolapse    Frequency of urination    History of UTI    History of vertigo    Hypertension    Nocturia    Pre-diabetes    DIET CONTROLLED    Medications:  No history of chronic anticoagulant use PTA  Assessment: Pharmacy has been consulted to initiate and monitor heparin in 76yo patient presenting to ED with chest pain. EKG showing normal sinus rhythm, right bundle branch block with no evidence of NSTEMI. Per EDP, suspect ACS at this point,  awaiting CT to rule out pulmonary embolism.   Goal of Therapy:  Heparin level 0.3-0.7 units/ml Monitor platelets by anticoagulation protocol: Yes  Date Time HL Rate/Comment 7/30 2109 0.17 SUBtherapeutic  7/31 0830 0.34 Therapeutic x 1 7/31     1534    0.25     SUBtherapeutic 8/01     0159    0.43     Therapeutic X 1   Plan:  8/1:  HL @ 0159 = 0.43, therapeutic X 1 Will continue pt on current rate and recheck HL in 8 hrs on 8/1 @ 1000.  Shelise Maron D 05/05/2022 3:03 AM

## 2022-05-05 NOTE — Discharge Summary (Signed)
Physician Discharge Summary  Rebekah Gibson [id number] DOB: May 22, 1946 DOA: 05/03/2022  PCP: Derinda Late, MD  Admit date: 05/03/2022 Discharge date: 05/05/2022  Admitted From: home Disposition:  transfer to [facility] for CABG  Recommendations for Outpatient Follow-up:  F/u as per physicians at [facility]: no Equipment/Devices:  Discharge Condition: stable  CODE STATUS: full  Diet recommendation: Heart Healthy / Carb Modified   Brief/Interim Summary: HPI was taken from Dr. Tobie Poet: Rebekah Gibson is a 76 year old female with history of hypertension who presents emergency department for chief concerns of chest pain.  Initial vitals in the emergency department showed temperature of 98, respiration rate of 13, heart rate of 87, blood pressure 160/90, SPO2 of 100% on room air.  Serum sodium is 128, potassium 3.4, chloride 94, bicarb 24, BUN of 13, serum creatinine of 0.67, GFR greater than 60, nonfasting blood glucose 199, WBC 6.7, hemoglobin 13.1, platelets of 248.  High sensitive troponin was initial 24, and increased to 718. BNP was 82.6.   ED treatment: Aspirin 324 mg p.o. one-time dose, heparin bolus and gtt.  Ativan 1 mg IV one-time dose.  EDP consulted unassigned cardiology, Dr. Yancey Flemings states he will see the patient.   At bedside, she was able to tell me her name, age, current calendar. She was able to identify her daughter and son at bedside.   She reports that over the last 2 years she has had on and off chest discomfort.  She states that it has progressed to the point where she was unable to mow her lawn last year.  She states that she did not get it evaluated due to concerns that it is from her lungs due to secondhand smoking.  She denies swelling of her lower extremities.   She reports that chest pressure is substernal and worse with exertion.   She denies radiation to her arms or neck.  She does endorse that she has chronic neck discomfort that she states is positional  and associated with laying in bed at certain positions at night.  She endorses bilateral distal finger numbness and decreased sensation.    Pt was found to have NSTEMI and is s/p cardiac cath which shows 3 vessel severe CAD and mod LV dysfunction as per cardio. Cardio recs transfer to [facility] for CABG. Rebekah Hilding MD CT surgeon phone [phone] is the accepting physician. Waiting on a bed at [facility] currently and will be transferred when a bed is available.    Discharge Diagnoses:  Principal Problem:   NSTEMI (non-ST elevated myocardial infarction) [facility]) Active Problems:   Essential hypertension   Hypercholesteremia   Hyperglycemia   Pulmonary nodule  NSTEMI: w/ elevated troponins. S/p cardiac cath which showed 3 vessel severe CAD & mod LV dysfunction as per cardio. Continue on IV heparin. Echo shows EF 25-30%, grade III diastolic dysfunction, LV demonstrates regional wall motion abnormalities & no atrial level shunt was detected.  Morphine prn. Pt is allergic to nitro. Cardio following and recs apprec    Pulmonary nodule: 7 mm right lower lobe groundglass nodule, incidental finding. Will need repeat imaging in 6 months. Pt and family are aware as per admitting physician    DM2: ? new onset as pt was not on any anti-DM meds as per med rec. HbA1c 7.9, poorly controlled. Started on and will continue on SSI w/ accuchecks    HLD: started on statin    HTN: can restart home dose of olmesartan, HCTZ when ok w/ cardio  Obesity: BMI 30.9. Complicates overall care & prognosis   Discharge Instructions  Discharge Instructions     Diet - low sodium heart healthy   Complete by: As directed    Diet Carb Modified   Complete by: As directed    Discharge instructions   Complete by: As directed    F/u as per physicians at [facility]   Increase activity slowly   Complete by: As directed       Allergies as of 05/05/2022       Reactions   Codeine Itching   Erythromycin Nausea And Vomiting    Nitroglycerin Other (See Comments)   Heart rate         Medication List     TAKE these medications    atorvastatin 20 MG tablet Commonly known as: LIPITOR Take 1 tablet (20 mg total) by mouth at bedtime.   Cayenne 450 MG Caps Take 1 capsule by mouth as directed.   cholecalciferol 25 MCG (1000 UNIT) tablet Commonly known as: VITAMIN D3 Take 1,000 Units by mouth daily.   Cinnamon 500 MG Tabs Take 1 tablet by mouth as directed.   Cranberry 400 MG Caps Take 1 capsule by mouth as directed.   diphenhydrAMINE 25 MG tablet Commonly known as: BENADRYL Take 25 mg by mouth every 6 (six) hours as needed (dizziness).   Ginkgo Biloba 500 MG Caps Take 2,000 mg by mouth as directed.   heparin 25000 UT/250ML infusion Inject 1,100 Units/hr into the vein continuous.   insulin aspart 100 UNIT/ML injection Commonly known as: novoLOG Inject 0-15 Units into the skin 3 (three) times daily with meals.   Magnesium 500 MG Tabs Take 1 tablet by mouth as directed.   morphine (PF) 2 MG/ML injection Inject 1 mL (2 mg total) into the vein every 3 (three) hours as needed.   olmesartan-hydrochlorothiazide 40-12.5 MG tablet Commonly known as: BENICAR HCT Take 1 tablet by mouth daily.        Allergies  Allergen Reactions   Codeine Itching   Erythromycin Nausea And Vomiting   Nitroglycerin Other (See Comments)    Heart rate     Consultations: Cardio: Dr. Neoma Laming    Procedures/Studies: CARDIAC CATHETERIZATION  Result Date: 05/05/2022   Prox RCA lesion is 100% stenosed.   Dist LM to Ost LAD lesion is 80% stenosed.   Mid LAD lesion is 70% stenosed.   Ost Cx to Prox Cx lesion is 80% stenosed.   Mid LM to Dist LM lesion is 75% stenosed.   There is moderate left ventricular systolic dysfunction.   LV end diastolic pressure is moderately elevated.   The left ventricular ejection fraction is 35-45% by visual estimate. Patient has severe three-vessel coronary artery disease with ostial  80% LAD lesion along with left main disease and occluded mid RCA with collaterals from the circumflex.  Left ventricular ejection fraction about 40% with dilated LV.  I spoke to [facility] cardiothoracic surgery and they have accepted the patient for CABG.  Patient will be transferred for CABG.   ECHOCARDIOGRAM COMPLETE  Result Date: 05/04/2022    ECHOCARDIOGRAM REPORT   Patient Name:   RONIT PANDY Date of Exam: 05/04/2022 Medical Rec #:  [id number]      Height:       59.0 in Accession #:    [id number]     Weight:       153.0 lb Date of Birth:  11-26-1945     BSA:  1.646 m Patient Age:    75 years       BP:           112/61 mmHg Patient Gender: F              HR:           73 bpm. Exam Location:  [facility] Procedure: 2D Echo, Cardiac Doppler, Color Doppler and Intracardiac            Opacification Agent Indications:     Elevated Troponin                  Chest pain R07.9  History:         Patient has no prior history of Echocardiogram examinations.                  Risk Factors:Hypertension.  Sonographer:     Cristela Blue Referring Phys:  [id number] AMY N COX Diagnosing Phys: Adrian Blackwater  Sonographer Comments: Suboptimal apical window and no subcostal window. IMPRESSIONS  1. Left ventricular ejection fraction, by estimation, is 25 to 30%. The left ventricle has severely decreased function. The left ventricle demonstrates regional wall motion abnormalities (see scoring diagram/findings for description). The left ventricular internal cavity size was severely dilated. There is mild left ventricular hypertrophy. Left ventricular diastolic parameters are consistent with Grade III diastolic dysfunction (restrictive).  2. Right ventricular systolic function is mildly reduced. The right ventricular size is moderately enlarged. Mildly increased right ventricular wall thickness.  3. Left atrial size was moderately dilated.  4. Right atrial size was moderately dilated.  5. The mitral valve is degenerative. Mild  mitral valve regurgitation.  6. The aortic valve is calcified. Aortic valve regurgitation is mild. Aortic valve sclerosis/calcification is present, without any evidence of aortic stenosis. FINDINGS  Left Ventricle: Left ventricular ejection fraction, by estimation, is 25 to 30%. The left ventricle has severely decreased function. The left ventricle demonstrates regional wall motion abnormalities. Definity contrast agent was given IV to delineate the left ventricular endocardial borders. The left ventricular internal cavity size was severely dilated. There is mild left ventricular hypertrophy. Left ventricular diastolic parameters are consistent with Grade III diastolic dysfunction (restrictive). Right Ventricle: The right ventricular size is moderately enlarged. Mildly increased right ventricular wall thickness. Right ventricular systolic function is mildly reduced. Left Atrium: Left atrial size was moderately dilated. Right Atrium: Right atrial size was moderately dilated. Pericardium: There is no evidence of pericardial effusion. Mitral Valve: The mitral valve is degenerative in appearance. Mild to moderate mitral annular calcification. Mild mitral valve regurgitation. Tricuspid Valve: The tricuspid valve is grossly normal. Tricuspid valve regurgitation is mild. Aortic Valve: The aortic valve is calcified. Aortic valve regurgitation is mild. Aortic valve sclerosis/calcification is present, without any evidence of aortic stenosis. Aortic valve mean gradient measures 3.0 mmHg. Aortic valve peak gradient measures 6.4 mmHg. Aortic valve area, by VTI measures 2.35 cm. Pulmonic Valve: The pulmonic valve was grossly normal. Pulmonic valve regurgitation is trivial. Aorta: The aortic root, ascending aorta and aortic arch are all structurally normal, with no evidence of dilitation or obstruction. IAS/Shunts: No atrial level shunt detected by color flow Doppler.  LEFT VENTRICLE PLAX 2D LVIDd:         4.40 cm     Diastology  LVIDs:         2.80 cm     LV e' medial:    3.70 cm/s LV PW:         1.30  cm     LV E/e' medial:  21.8 LV IVS:        1.20 cm     LV e' lateral:   3.70 cm/s LVOT diam:     2.10 cm     LV E/e' lateral: 21.8 LV SV:         50 LV SV Index:   30 LVOT Area:     3.46 cm  LV Volumes (MOD) LV vol d, MOD A2C: 34.0 ml LV vol d, MOD A4C: 50.8 ml LV vol s, MOD A2C: 19.3 ml LV vol s, MOD A4C: 42.6 ml LV SV MOD A2C:     14.7 ml LV SV MOD A4C:     50.8 ml LV SV MOD BP:      11.4 ml RIGHT VENTRICLE RV Basal diam:  3.20 cm RV S prime:     10.80 cm/s TAPSE (M-mode): 1.6 cm LEFT ATRIUM             Index        RIGHT ATRIUM          Index LA diam:        3.80 cm 2.31 cm/m   RA Area:     8.97 cm LA Vol (A2C):   59.9 ml 36.39 ml/m  RA Volume:   16.30 ml 9.90 ml/m LA Vol (A4C):   40.0 ml 24.30 ml/m LA Biplane Vol: 53.7 ml 32.63 ml/m  AORTIC VALVE AV Area (Vmax):    1.83 cm AV Area (Vmean):   1.55 cm AV Area (VTI):     2.35 cm AV Vmax:           126.00 cm/s AV Vmean:          81.600 cm/s AV VTI:            0.211 m AV Peak Grad:      6.4 mmHg AV Mean Grad:      3.0 mmHg LVOT Vmax:         66.60 cm/s LVOT Vmean:        36.500 cm/s LVOT VTI:          0.143 m LVOT/AV VTI ratio: 0.68  AORTA Ao Root diam: 2.90 cm MITRAL VALVE                TRICUSPID VALVE MV Area (PHT): 3.74 cm     TR Peak grad:   6.7 mmHg MV Decel Time: 203 msec     TR Vmax:        129.00 cm/s MV E velocity: 80.60 cm/s MV A velocity: 116.00 cm/s  SHUNTS MV E/A ratio:  0.69         Systemic VTI:  0.14 m                             Systemic Diam: 2.10 cm Adrian Blackwater Electronically signed by Adrian Blackwater Signature Date/Time: 05/04/2022/3:46:46 PM    Final    CT Angio Chest PE W and/or Wo Contrast  Result Date: 05/03/2022 CLINICAL DATA:  76 year old female with acute chest pain and weakness. EXAM: CT ANGIOGRAPHY CHEST WITH CONTRAST TECHNIQUE: Multidetector CT imaging of the chest was performed using the standard protocol during bolus administration of intravenous  contrast. Multiplanar CT image reconstructions and MIPs were obtained to evaluate the vascular anatomy. RADIATION DOSE REDUCTION: This exam was performed according to the departmental dose-optimization program which includes automated exposure control, adjustment  of the mA and/or kV according to patient size and/or use of iterative reconstruction technique. CONTRAST:  75mL OMNIPAQUE IOHEXOL 350 MG/ML SOLN COMPARISON:  05/03/2022 chest radiograph. 09/20/2006 abdominal CT and prior studies FINDINGS: Cardiovascular: This is a technically satisfactory study. No pulmonary emboli are identified. Mild cardiomegaly noted. Coronary artery and aortic atherosclerotic calcifications are present. No thoracic aortic aneurysm or pericardial effusion identified. Mediastinum/Nodes: No enlarged mediastinal, hilar, or axillary lymph nodes. Thyroid gland, trachea, and esophagus demonstrate no significant findings. A small hiatal hernia is noted. Lungs/Pleura: Mild dependent and basilar opacities are noted. A 7 mm RIGHT LOWER lobe ground-glass nodule (series 5: Image 67) is noted. There is no evidence of discrete mass, consolidation, pleural effusion or pneumothorax. Upper Abdomen: No acute abnormality. A stable 2.2 x 3.7 cm RIGHT adrenal mass is compatible with a benign process-either adenoma or myelolipoma. Musculoskeletal: No acute or suspicious bony abnormalities are noted. Review of the MIP images confirms the above findings. IMPRESSION: 1. No evidence of pulmonary emboli or thoracic aortic aneurysm. 2. Mild dependent and basilar opacities-favor atelectasis. 3. 7 mm RIGHT LOWER lobe ground-glass nodule. Initial follow-up with CT at 6 months is recommended to confirm persistence. If persistent, repeat CT is recommended every 2 years until 5 years of stability has been established. This recommendation follows the consensus statement: Guidelines for Management of Incidental Pulmonary Nodules Detected on CT Images: From the Fleischner  Society 2017; Radiology 2017; 284:228-243. 4. Small hiatal hernia. 5. Coronary artery disease and aortic Atherosclerosis (ICD10-I70.0). Electronically Signed   By: Harmon Pier M.D.   On: 05/03/2022 14:43   DG Chest 2 View  Result Date: 05/03/2022 CLINICAL DATA:  76 year old female with chest pain and tightness. EXAM: CHEST - 2 VIEW COMPARISON:  Chest radiographs 05/08/2016 and earlier. FINDINGS: Stable mediastinal contours. No cardiomegaly. Calcified aortic atherosclerosis. Visualized tracheal air column is within normal limits. Lung volumes are within normal limits. No pneumothorax, pulmonary edema, pleural effusion or confluent pulmonary opacity. Negative visible bowel gas. Exaggerated thoracic kyphosis. Osteopenia. No acute osseous abnormality identified. IMPRESSION: No acute cardiopulmonary abnormality. Aortic Atherosclerosis (ICD10-I70.0). Electronically Signed   By: Odessa Fleming M.D.   On: 05/03/2022 11:15   (Echo, Carotid, EGD, Colonoscopy, ERCP)    Subjective: Pt c/o fatigue    Discharge Exam: Vitals:   05/05/22 1354 05/05/22 1500  BP: (!) 145/73 131/76  Pulse:    Resp: 18 18  Temp: 98.1 F (36.7 C) 98.1 F (36.7 C)  SpO2:  98%   Vitals:   05/05/22 1254 05/05/22 1332 05/05/22 1354 05/05/22 1500  BP: (!) 143/87 (!) 143/85 (!) 145/73 131/76  Pulse: 92     Resp: 18 18 18 18   Temp: 98.1 F (36.7 C) 98.1 F (36.7 C) 98.1 F (36.7 C) 98.1 F (36.7 C)  TempSrc: Oral Oral    SpO2: 100%   98%  Weight:      Height:        General: Pt is alert, awake, not in acute distress Cardiovascular:  S1/S2 +, no rubs, no gallops Respiratory: CTA bilaterally, no wheezing, no rhonchi Abdominal: Soft, NT, obese, bowel sounds + Extremities: no cyanosis    The results of significant diagnostics from this hospitalization (including imaging, microbiology, ancillary and laboratory) are listed below for reference.     Microbiology: Recent Results (from the past 240 hour(s))  SARS Coronavirus 2  by RT PCR (hospital order, performed in [facility] hospital lab) *cepheid single result test* Anterior Nasal Swab     Status:  None   Collection Time: 05/03/22  9:09 PM   Specimen: Anterior Nasal Swab  Result Value Ref Range Status   SARS Coronavirus 2 by RT PCR NEGATIVE NEGATIVE Final    Comment: (NOTE) SARS-CoV-2 target nucleic acids are NOT DETECTED.  The SARS-CoV-2 RNA is generally detectable in upper and lower respiratory specimens during the acute phase of infection. The lowest concentration of SARS-CoV-2 viral copies this assay can detect is 250 copies / mL. A negative result does not preclude SARS-CoV-2 infection and should not be used as the sole basis for treatment or other patient management decisions.  A negative result may occur with improper specimen collection / handling, submission of specimen other than nasopharyngeal swab, presence of viral mutation(s) within the areas targeted by this assay, and inadequate number of viral copies (<250 copies / mL). A negative result must be combined with clinical observations, patient history, and epidemiological information.  Fact Sheet for Patients:   https://www.patel.info/  Fact Sheet for Healthcare Providers: https://hall.com/  This test is not yet approved or  cleared by the [place] FDA and has been authorized for detection and/or diagnosis of SARS-CoV-2 by FDA under an Emergency Use Authorization (EUA).  This EUA will remain in effect (meaning this test can be used) for the duration of the COVID-19 declaration under Section 564(b)(1) of the Act, 21 U.S.C. section 360bbb-3(b)(1), unless the authorization is terminated or revoked sooner.  Performed at [facility], [address] [place], [place] [address]      Labs: BNP (last 3 results) Recent Labs    05/03/22 1035  BNP [id number]   Basic Metabolic Panel: Recent Labs  Lab 05/03/22 1035 05/03/22 1255  05/04/22 0520 05/05/22 0159  NA 128*  --  134* 134*  K 3.4*  --  3.8 3.5  CL 94*  --  103 102  CO2 24  --  23 22  GLUCOSE 199*  --  171* 162*  BUN 13  --  12 15  CREATININE 0.67  --  0.57 0.59  CALCIUM 8.9  --  8.7* 8.3*  MG  --  2.0  --   --    Liver Function Tests: Recent Labs  Lab 05/03/22 1035  AST 24  ALT 29  ALKPHOS 91  BILITOT 0.8  PROT 7.0  ALBUMIN 4.1   No results for input(s): "LIPASE", "AMYLASE" in the last 168 hours. No results for input(s): "AMMONIA" in the last 168 hours. CBC: Recent Labs  Lab 05/03/22 1035 05/04/22 0520 05/05/22 0159  WBC 6.7 7.5 8.1  HGB 13.1 12.2 12.0  HCT 38.1 35.9* 35.0*  MCV 83.0 84.5 83.5  PLT 248 218 224   Cardiac Enzymes: No results for input(s): "CKTOTAL", "CKMB", "CKMBINDEX", "TROPONINI" in the last 168 hours. BNP: Invalid input(s): "POCBNP" CBG: Recent Labs  Lab 05/04/22 1133 05/04/22 1621 05/04/22 2016 05/05/22 0753 05/05/22 1253  GLUCAP 219* 154* 200* 175* 126*   D-Dimer No results for input(s): "DDIMER" in the last 72 hours. Hgb A1c No results for input(s): "HGBA1C" in the last 72 hours. Lipid Profile No results for input(s): "CHOL", "HDL", "LDLCALC", "TRIG", "CHOLHDL", "LDLDIRECT" in the last 72 hours. Thyroid function studies Recent Labs    05/03/22 1035  TSH 1.023   Anemia work up No results for input(s): "VITAMINB12", "FOLATE", "FERRITIN", "TIBC", "IRON", "RETICCTPCT" in the last 72 hours. Urinalysis    Component Value Date/Time   COLORURINE YELLOW (A) 05/03/2022 1035   APPEARANCEUR CLEAR (A) 05/03/2022 1035   LABSPEC  1.012 05/03/2022 1035   PHURINE 7.0 05/03/2022 1035   GLUCOSEU 50 (A) 05/03/2022 1035   HGBUR SMALL (A) 05/03/2022 1035   BILIRUBINUR NEGATIVE 05/03/2022 1035   KETONESUR 5 (A) 05/03/2022 1035   PROTEINUR NEGATIVE 05/03/2022 1035   UROBILINOGEN 0.2 12/11/2012 1433   NITRITE NEGATIVE 05/03/2022 1035   LEUKOCYTESUR TRACE (A) 05/03/2022 1035   Sepsis Labs Recent Labs  Lab  05/03/22 1035 05/04/22 0520 05/05/22 0159  WBC 6.7 7.5 8.1   Microbiology Recent Results (from the past 240 hour(s))  SARS Coronavirus 2 by RT PCR (hospital order, performed in [place] hospital lab) *cepheid single result test* Anterior Nasal Swab     Status: None   Collection Time: 05/03/22  9:09 PM   Specimen: Anterior Nasal Swab  Result Value Ref Range Status   SARS Coronavirus 2 by RT PCR NEGATIVE NEGATIVE Final    Comment: (NOTE) SARS-CoV-2 target nucleic acids are NOT DETECTED.  The SARS-CoV-2 RNA is generally detectable in upper and lower respiratory specimens during the acute phase of infection. The lowest concentration of SARS-CoV-2 viral copies this assay can detect is 250 copies / mL. A negative result does not preclude SARS-CoV-2 infection and should not be used as the sole basis for treatment or other patient management decisions.  A negative result may occur with improper specimen collection / handling, submission of specimen other than nasopharyngeal swab, presence of viral mutation(s) within the areas targeted by this assay, and inadequate number of viral copies (<250 copies / mL). A negative result must be combined with clinical observations, patient history, and epidemiological information.  Fact Sheet for Patients:   https://www.patel.info/  Fact Sheet for Healthcare Providers: https://hall.com/  This test is not yet approved or  cleared by the [place] FDA and has been authorized for detection and/or diagnosis of SARS-CoV-2 by FDA under an Emergency Use Authorization (EUA).  This EUA will remain in effect (meaning this test can be used) for the duration of the COVID-19 declaration under Section 564(b)(1) of the Act, 21 U.S.C. section 360bbb-3(b)(1), unless the authorization is terminated or revoked sooner.  Performed at [facility], [address] [place], [place] [address]      Time  coordinating discharge: Over 30 minutes  SIGNED:   Wyvonnia Dusky, MD  [facility] 05/05/2022, 3:23 PM Pager   If 7PM-7AM, please contact night-coverage [email or web address]

## 2022-05-05 NOTE — Consult Note (Signed)
ANTICOAGULATION CONSULT NOTE - Follow up consult  Pharmacy Consult for Heparin Indication: chest pain/ACS  Allergies  Allergen Reactions   Codeine Itching   Erythromycin Nausea And Vomiting   Nitroglycerin Other (See Comments)    Heart rate     Patient Measurements: Height: 4\' 11"  (149.9 cm) Weight: 69.4 kg (153 lb) IBW/kg (Calculated) : 43.2 Heparin Dosing Weight: 58.6 kg  Vital Signs: Temp: 98.1 F (36.7 C) (08/01 1354) Temp Source: Oral (08/01 1332) BP: 145/73 (08/01 1354) Pulse Rate: 92 (08/01 1254)  Labs: Recent Labs    05/03/22 1035 05/03/22 1253 05/03/22 1408 05/03/22 1938 05/03/22 2109 05/04/22 0520 05/04/22 0830 05/04/22 1534 05/05/22 0159 05/05/22 1320  HGB 13.1  --   --   --   --  12.2  --   --  12.0  --   HCT 38.1  --   --   --   --  35.9*  --   --  35.0*  --   PLT 248  --   --   --   --  218  --   --  224  --   APTT  --   --  29  --   --   --   --   --   --   --   LABPROT  --   --  13.0  --   --   --   --   --   --   --   INR  --   --  1.0  --   --   --   --   --   --   --   HEPARINUNFRC  --   --   --   --  0.17*  --    < > 0.25* 0.43 <0.10*  CREATININE 0.67  --   --   --   --  0.57  --   --  0.59  --   TROPONINIHS 24* 718*  --  3,781* 3,868*  --   --   --   --   --    < > = values in this interval not displayed.     Estimated Creatinine Clearance: 51.5 mL/min (by C-G formula based on SCr of 0.59 mg/dL).   Medical History: Past Medical History:  Diagnosis Date   EKG, abnormal    Female bladder prolapse    Frequency of urination    History of UTI    History of vertigo    Hypertension    Nocturia    Pre-diabetes    DIET CONTROLLED    Medications:  No history of chronic anticoagulant use PTA  Assessment: Pharmacy has been consulted to initiate and monitor heparin in 75yo patient presenting to ED with chest pain. EKG showing normal sinus rhythm, right bundle branch block with no evidence of NSTEMI. Per EDP, suspect ACS at this point,  awaiting CT to rule out pulmonary embolism.   Goal of Therapy:  Heparin level 0.3-0.7 units/ml Monitor platelets by anticoagulation protocol: Yes  Date Time HL Rate/Comment 7/30 2109 0.17 SUBtherapeutic  7/31 0830 0.34 Therapeutic x 1 7/31     1534    0.25     SUBtherapeutic 8/01     0159    0.43     Therapeutic X 1 @ 1100 un/hr 8/01 1320 <0.10 Heparin gtt held during cathlab, resumed at 1249  Plan:  NO bolus, heparin held by cardiology during cathlab and initiated per Card orders. Will  continue heparin at 1100 units/hr and recheck HL in 8 hrs Patient waiting transfer to [facility] for CABG (already accepted)  Destiney Sanabia Rodriguez-Guzman PharmD, BCPS 05/05/2022 2:13 PM

## 2022-05-05 NOTE — Progress Notes (Signed)
Report called to [facility] RN and to Madelaine Bhat in transport, IV's to remain in place. SBAR followed, questions asked and answered. Family aware of transfer.

## 2022-05-06 DIAGNOSIS — I5020 Unspecified systolic (congestive) heart failure: Secondary | ICD-10-CM | POA: Diagnosis not present

## 2022-05-06 DIAGNOSIS — R079 Chest pain, unspecified: Secondary | ICD-10-CM | POA: Diagnosis not present

## 2022-05-07 DIAGNOSIS — I214 Non-ST elevation (NSTEMI) myocardial infarction: Secondary | ICD-10-CM | POA: Diagnosis not present

## 2022-05-08 DIAGNOSIS — I452 Bifascicular block: Secondary | ICD-10-CM | POA: Diagnosis not present

## 2022-05-08 DIAGNOSIS — I214 Non-ST elevation (NSTEMI) myocardial infarction: Secondary | ICD-10-CM | POA: Diagnosis not present

## 2022-05-08 DIAGNOSIS — R9431 Abnormal electrocardiogram [ECG] [EKG]: Secondary | ICD-10-CM | POA: Diagnosis not present

## 2022-05-08 DIAGNOSIS — I4510 Unspecified right bundle-branch block: Secondary | ICD-10-CM | POA: Diagnosis not present

## 2022-05-08 DIAGNOSIS — I444 Left anterior fascicular block: Secondary | ICD-10-CM | POA: Diagnosis not present

## 2022-05-14 DIAGNOSIS — I25118 Atherosclerotic heart disease of native coronary artery with other forms of angina pectoris: Secondary | ICD-10-CM | POA: Diagnosis not present

## 2022-05-14 DIAGNOSIS — I152 Hypertension secondary to endocrine disorders: Secondary | ICD-10-CM | POA: Diagnosis not present

## 2022-05-14 DIAGNOSIS — M791 Myalgia, unspecified site: Secondary | ICD-10-CM | POA: Diagnosis not present

## 2022-05-14 DIAGNOSIS — E1159 Type 2 diabetes mellitus with other circulatory complications: Secondary | ICD-10-CM | POA: Diagnosis not present

## 2022-05-14 DIAGNOSIS — E7800 Pure hypercholesterolemia, unspecified: Secondary | ICD-10-CM | POA: Diagnosis not present

## 2022-05-14 DIAGNOSIS — I214 Non-ST elevation (NSTEMI) myocardial infarction: Secondary | ICD-10-CM | POA: Diagnosis not present

## 2022-05-14 DIAGNOSIS — Z79899 Other long term (current) drug therapy: Secondary | ICD-10-CM | POA: Diagnosis not present

## 2022-06-03 DIAGNOSIS — Z885 Allergy status to narcotic agent status: Secondary | ICD-10-CM | POA: Diagnosis not present

## 2022-06-03 DIAGNOSIS — E7800 Pure hypercholesterolemia, unspecified: Secondary | ICD-10-CM | POA: Diagnosis not present

## 2022-06-03 DIAGNOSIS — K449 Diaphragmatic hernia without obstruction or gangrene: Secondary | ICD-10-CM | POA: Diagnosis not present

## 2022-06-03 DIAGNOSIS — I2510 Atherosclerotic heart disease of native coronary artery without angina pectoris: Secondary | ICD-10-CM | POA: Diagnosis not present

## 2022-06-03 DIAGNOSIS — Z7982 Long term (current) use of aspirin: Secondary | ICD-10-CM | POA: Diagnosis not present

## 2022-06-03 DIAGNOSIS — R911 Solitary pulmonary nodule: Secondary | ICD-10-CM | POA: Diagnosis not present

## 2022-06-03 DIAGNOSIS — R9431 Abnormal electrocardiogram [ECG] [EKG]: Secondary | ICD-10-CM | POA: Diagnosis not present

## 2022-06-03 DIAGNOSIS — I4510 Unspecified right bundle-branch block: Secondary | ICD-10-CM | POA: Diagnosis not present

## 2022-06-03 DIAGNOSIS — R079 Chest pain, unspecified: Secondary | ICD-10-CM | POA: Diagnosis not present

## 2022-06-03 DIAGNOSIS — Z7902 Long term (current) use of antithrombotics/antiplatelets: Secondary | ICD-10-CM | POA: Diagnosis not present

## 2022-06-03 DIAGNOSIS — I208 Other forms of angina pectoris: Secondary | ICD-10-CM | POA: Diagnosis not present

## 2022-06-03 DIAGNOSIS — Z7984 Long term (current) use of oral hypoglycemic drugs: Secondary | ICD-10-CM | POA: Diagnosis not present

## 2022-06-03 DIAGNOSIS — I252 Old myocardial infarction: Secondary | ICD-10-CM | POA: Diagnosis not present

## 2022-06-03 DIAGNOSIS — I110 Hypertensive heart disease with heart failure: Secondary | ICD-10-CM | POA: Diagnosis not present

## 2022-06-03 DIAGNOSIS — I5022 Chronic systolic (congestive) heart failure: Secondary | ICD-10-CM | POA: Diagnosis not present

## 2022-06-03 DIAGNOSIS — Z79899 Other long term (current) drug therapy: Secondary | ICD-10-CM | POA: Diagnosis not present

## 2022-06-04 DIAGNOSIS — R079 Chest pain, unspecified: Secondary | ICD-10-CM | POA: Diagnosis not present

## 2022-07-14 DIAGNOSIS — I252 Old myocardial infarction: Secondary | ICD-10-CM | POA: Diagnosis not present

## 2022-07-14 DIAGNOSIS — R079 Chest pain, unspecified: Secondary | ICD-10-CM | POA: Diagnosis not present

## 2022-07-23 DIAGNOSIS — I252 Old myocardial infarction: Secondary | ICD-10-CM | POA: Diagnosis not present

## 2022-07-23 DIAGNOSIS — R079 Chest pain, unspecified: Secondary | ICD-10-CM | POA: Diagnosis not present

## 2022-08-07 DIAGNOSIS — Z79899 Other long term (current) drug therapy: Secondary | ICD-10-CM | POA: Diagnosis not present

## 2022-08-07 DIAGNOSIS — I152 Hypertension secondary to endocrine disorders: Secondary | ICD-10-CM | POA: Diagnosis not present

## 2022-08-07 DIAGNOSIS — E1159 Type 2 diabetes mellitus with other circulatory complications: Secondary | ICD-10-CM | POA: Diagnosis not present

## 2022-08-19 DIAGNOSIS — I209 Angina pectoris, unspecified: Secondary | ICD-10-CM | POA: Diagnosis not present

## 2022-08-19 DIAGNOSIS — E7800 Pure hypercholesterolemia, unspecified: Secondary | ICD-10-CM | POA: Diagnosis not present

## 2022-08-19 DIAGNOSIS — I25110 Atherosclerotic heart disease of native coronary artery with unstable angina pectoris: Secondary | ICD-10-CM | POA: Diagnosis not present

## 2022-08-20 DIAGNOSIS — E1159 Type 2 diabetes mellitus with other circulatory complications: Secondary | ICD-10-CM | POA: Diagnosis not present

## 2022-08-20 DIAGNOSIS — I152 Hypertension secondary to endocrine disorders: Secondary | ICD-10-CM | POA: Diagnosis not present

## 2022-08-20 DIAGNOSIS — I25118 Atherosclerotic heart disease of native coronary artery with other forms of angina pectoris: Secondary | ICD-10-CM | POA: Diagnosis not present

## 2022-08-20 DIAGNOSIS — E7800 Pure hypercholesterolemia, unspecified: Secondary | ICD-10-CM | POA: Diagnosis not present

## 2022-08-20 DIAGNOSIS — T466X5A Adverse effect of antihyperlipidemic and antiarteriosclerotic drugs, initial encounter: Secondary | ICD-10-CM | POA: Diagnosis not present

## 2022-08-20 DIAGNOSIS — Z79899 Other long term (current) drug therapy: Secondary | ICD-10-CM | POA: Diagnosis not present

## 2022-08-20 DIAGNOSIS — M791 Myalgia, unspecified site: Secondary | ICD-10-CM | POA: Diagnosis not present

## 2022-11-18 DIAGNOSIS — I25118 Atherosclerotic heart disease of native coronary artery with other forms of angina pectoris: Secondary | ICD-10-CM | POA: Diagnosis not present

## 2022-11-18 DIAGNOSIS — E7800 Pure hypercholesterolemia, unspecified: Secondary | ICD-10-CM | POA: Diagnosis not present

## 2023-02-10 DIAGNOSIS — Z79899 Other long term (current) drug therapy: Secondary | ICD-10-CM | POA: Diagnosis not present

## 2023-02-10 DIAGNOSIS — I152 Hypertension secondary to endocrine disorders: Secondary | ICD-10-CM | POA: Diagnosis not present

## 2023-02-10 DIAGNOSIS — E1159 Type 2 diabetes mellitus with other circulatory complications: Secondary | ICD-10-CM | POA: Diagnosis not present

## 2023-02-10 DIAGNOSIS — E7800 Pure hypercholesterolemia, unspecified: Secondary | ICD-10-CM | POA: Diagnosis not present

## 2023-02-16 DIAGNOSIS — E119 Type 2 diabetes mellitus without complications: Secondary | ICD-10-CM | POA: Diagnosis not present

## 2023-02-16 DIAGNOSIS — I152 Hypertension secondary to endocrine disorders: Secondary | ICD-10-CM | POA: Diagnosis not present

## 2023-02-16 DIAGNOSIS — I25118 Atherosclerotic heart disease of native coronary artery with other forms of angina pectoris: Secondary | ICD-10-CM | POA: Diagnosis not present

## 2023-02-16 DIAGNOSIS — I5020 Unspecified systolic (congestive) heart failure: Secondary | ICD-10-CM | POA: Diagnosis not present

## 2023-02-16 DIAGNOSIS — E1159 Type 2 diabetes mellitus with other circulatory complications: Secondary | ICD-10-CM | POA: Diagnosis not present

## 2023-02-16 DIAGNOSIS — Z0000 Encounter for general adult medical examination without abnormal findings: Secondary | ICD-10-CM | POA: Diagnosis not present

## 2023-02-16 DIAGNOSIS — E7800 Pure hypercholesterolemia, unspecified: Secondary | ICD-10-CM | POA: Diagnosis not present

## 2023-02-16 DIAGNOSIS — Z79899 Other long term (current) drug therapy: Secondary | ICD-10-CM | POA: Diagnosis not present

## 2023-02-16 DIAGNOSIS — I10 Essential (primary) hypertension: Secondary | ICD-10-CM | POA: Diagnosis not present

## 2023-02-16 DIAGNOSIS — Z1331 Encounter for screening for depression: Secondary | ICD-10-CM | POA: Diagnosis not present

## 2023-05-19 DIAGNOSIS — K449 Diaphragmatic hernia without obstruction or gangrene: Secondary | ICD-10-CM | POA: Diagnosis not present

## 2023-05-19 DIAGNOSIS — I25118 Atherosclerotic heart disease of native coronary artery with other forms of angina pectoris: Secondary | ICD-10-CM | POA: Diagnosis not present

## 2023-05-19 DIAGNOSIS — I214 Non-ST elevation (NSTEMI) myocardial infarction: Secondary | ICD-10-CM | POA: Diagnosis not present

## 2023-05-19 DIAGNOSIS — R739 Hyperglycemia, unspecified: Secondary | ICD-10-CM | POA: Diagnosis not present

## 2023-05-19 DIAGNOSIS — R918 Other nonspecific abnormal finding of lung field: Secondary | ICD-10-CM | POA: Diagnosis not present

## 2023-05-19 DIAGNOSIS — Z7984 Long term (current) use of oral hypoglycemic drugs: Secondary | ICD-10-CM | POA: Diagnosis not present

## 2023-05-19 DIAGNOSIS — I110 Hypertensive heart disease with heart failure: Secondary | ICD-10-CM | POA: Diagnosis not present

## 2023-05-19 DIAGNOSIS — I3481 Nonrheumatic mitral (valve) annulus calcification: Secondary | ICD-10-CM | POA: Diagnosis not present

## 2023-05-19 DIAGNOSIS — Z7982 Long term (current) use of aspirin: Secondary | ICD-10-CM | POA: Diagnosis not present

## 2023-05-19 DIAGNOSIS — I5020 Unspecified systolic (congestive) heart failure: Secondary | ICD-10-CM | POA: Diagnosis not present

## 2023-05-19 DIAGNOSIS — E7800 Pure hypercholesterolemia, unspecified: Secondary | ICD-10-CM | POA: Diagnosis not present

## 2023-05-27 DIAGNOSIS — Z7902 Long term (current) use of antithrombotics/antiplatelets: Secondary | ICD-10-CM | POA: Diagnosis not present

## 2023-05-27 DIAGNOSIS — E119 Type 2 diabetes mellitus without complications: Secondary | ICD-10-CM | POA: Diagnosis not present

## 2023-05-27 DIAGNOSIS — E7800 Pure hypercholesterolemia, unspecified: Secondary | ICD-10-CM | POA: Diagnosis not present

## 2023-05-27 DIAGNOSIS — Z885 Allergy status to narcotic agent status: Secondary | ICD-10-CM | POA: Diagnosis not present

## 2023-05-27 DIAGNOSIS — I110 Hypertensive heart disease with heart failure: Secondary | ICD-10-CM | POA: Diagnosis not present

## 2023-05-27 DIAGNOSIS — I4510 Unspecified right bundle-branch block: Secondary | ICD-10-CM | POA: Diagnosis not present

## 2023-05-27 DIAGNOSIS — Z79899 Other long term (current) drug therapy: Secondary | ICD-10-CM | POA: Diagnosis not present

## 2023-05-27 DIAGNOSIS — Z7982 Long term (current) use of aspirin: Secondary | ICD-10-CM | POA: Diagnosis not present

## 2023-05-27 DIAGNOSIS — R9431 Abnormal electrocardiogram [ECG] [EKG]: Secondary | ICD-10-CM | POA: Diagnosis not present

## 2023-05-27 DIAGNOSIS — I25118 Atherosclerotic heart disease of native coronary artery with other forms of angina pectoris: Secondary | ICD-10-CM | POA: Diagnosis not present

## 2023-05-27 DIAGNOSIS — I080 Rheumatic disorders of both mitral and aortic valves: Secondary | ICD-10-CM | POA: Diagnosis not present

## 2023-05-27 DIAGNOSIS — I5040 Unspecified combined systolic (congestive) and diastolic (congestive) heart failure: Secondary | ICD-10-CM | POA: Diagnosis not present

## 2023-05-31 DIAGNOSIS — Z01810 Encounter for preprocedural cardiovascular examination: Secondary | ICD-10-CM | POA: Diagnosis not present

## 2023-05-31 DIAGNOSIS — I6523 Occlusion and stenosis of bilateral carotid arteries: Secondary | ICD-10-CM | POA: Diagnosis not present

## 2023-05-31 DIAGNOSIS — Z01818 Encounter for other preprocedural examination: Secondary | ICD-10-CM | POA: Diagnosis not present

## 2023-05-31 DIAGNOSIS — I2510 Atherosclerotic heart disease of native coronary artery without angina pectoris: Secondary | ICD-10-CM | POA: Diagnosis not present

## 2023-06-03 DIAGNOSIS — Z7982 Long term (current) use of aspirin: Secondary | ICD-10-CM | POA: Diagnosis not present

## 2023-06-03 DIAGNOSIS — Z79899 Other long term (current) drug therapy: Secondary | ICD-10-CM | POA: Diagnosis not present

## 2023-06-03 DIAGNOSIS — Z7902 Long term (current) use of antithrombotics/antiplatelets: Secondary | ICD-10-CM | POA: Diagnosis not present

## 2023-06-03 DIAGNOSIS — I214 Non-ST elevation (NSTEMI) myocardial infarction: Secondary | ICD-10-CM | POA: Diagnosis not present

## 2023-06-03 DIAGNOSIS — I2510 Atherosclerotic heart disease of native coronary artery without angina pectoris: Secondary | ICD-10-CM | POA: Diagnosis not present

## 2023-06-03 DIAGNOSIS — I252 Old myocardial infarction: Secondary | ICD-10-CM | POA: Diagnosis not present

## 2023-06-03 DIAGNOSIS — R799 Abnormal finding of blood chemistry, unspecified: Secondary | ICD-10-CM | POA: Diagnosis not present

## 2023-06-03 DIAGNOSIS — E119 Type 2 diabetes mellitus without complications: Secondary | ICD-10-CM | POA: Diagnosis not present

## 2023-06-03 DIAGNOSIS — Z7984 Long term (current) use of oral hypoglycemic drugs: Secondary | ICD-10-CM | POA: Diagnosis not present

## 2023-06-03 DIAGNOSIS — I5020 Unspecified systolic (congestive) heart failure: Secondary | ICD-10-CM | POA: Diagnosis not present

## 2023-06-03 DIAGNOSIS — E7800 Pure hypercholesterolemia, unspecified: Secondary | ICD-10-CM | POA: Diagnosis not present

## 2023-06-03 DIAGNOSIS — I25110 Atherosclerotic heart disease of native coronary artery with unstable angina pectoris: Secondary | ICD-10-CM | POA: Diagnosis not present

## 2023-06-03 DIAGNOSIS — I25118 Atherosclerotic heart disease of native coronary artery with other forms of angina pectoris: Secondary | ICD-10-CM | POA: Diagnosis not present

## 2023-06-03 DIAGNOSIS — I10 Essential (primary) hypertension: Secondary | ICD-10-CM | POA: Diagnosis not present

## 2023-06-03 DIAGNOSIS — K219 Gastro-esophageal reflux disease without esophagitis: Secondary | ICD-10-CM | POA: Diagnosis not present

## 2023-06-03 DIAGNOSIS — Z885 Allergy status to narcotic agent status: Secondary | ICD-10-CM | POA: Diagnosis not present

## 2023-06-09 DIAGNOSIS — N390 Urinary tract infection, site not specified: Secondary | ICD-10-CM | POA: Diagnosis not present

## 2023-06-09 DIAGNOSIS — Z4682 Encounter for fitting and adjustment of non-vascular catheter: Secondary | ICD-10-CM | POA: Diagnosis not present

## 2023-06-09 DIAGNOSIS — E1165 Type 2 diabetes mellitus with hyperglycemia: Secondary | ICD-10-CM | POA: Diagnosis not present

## 2023-06-09 DIAGNOSIS — Q2112 Patent foramen ovale: Secondary | ICD-10-CM | POA: Diagnosis not present

## 2023-06-09 DIAGNOSIS — I5020 Unspecified systolic (congestive) heart failure: Secondary | ICD-10-CM | POA: Diagnosis not present

## 2023-06-09 DIAGNOSIS — I25118 Atherosclerotic heart disease of native coronary artery with other forms of angina pectoris: Secondary | ICD-10-CM | POA: Diagnosis not present

## 2023-06-09 DIAGNOSIS — E782 Mixed hyperlipidemia: Secondary | ICD-10-CM | POA: Diagnosis not present

## 2023-06-09 DIAGNOSIS — G8918 Other acute postprocedural pain: Secondary | ICD-10-CM | POA: Diagnosis not present

## 2023-06-09 DIAGNOSIS — Z7902 Long term (current) use of antithrombotics/antiplatelets: Secondary | ICD-10-CM | POA: Diagnosis not present

## 2023-06-09 DIAGNOSIS — I510 Cardiac septal defect, acquired: Secondary | ICD-10-CM | POA: Diagnosis not present

## 2023-06-09 DIAGNOSIS — J984 Other disorders of lung: Secondary | ICD-10-CM | POA: Diagnosis not present

## 2023-06-09 DIAGNOSIS — Z9911 Dependence on respirator [ventilator] status: Secondary | ICD-10-CM | POA: Diagnosis not present

## 2023-06-09 DIAGNOSIS — I340 Nonrheumatic mitral (valve) insufficiency: Secondary | ICD-10-CM | POA: Diagnosis not present

## 2023-06-09 DIAGNOSIS — Z7984 Long term (current) use of oral hypoglycemic drugs: Secondary | ICD-10-CM | POA: Diagnosis not present

## 2023-06-09 DIAGNOSIS — R918 Other nonspecific abnormal finding of lung field: Secondary | ICD-10-CM | POA: Diagnosis not present

## 2023-06-09 DIAGNOSIS — I959 Hypotension, unspecified: Secondary | ICD-10-CM | POA: Diagnosis not present

## 2023-06-09 DIAGNOSIS — Z9889 Other specified postprocedural states: Secondary | ICD-10-CM | POA: Diagnosis not present

## 2023-06-09 DIAGNOSIS — R001 Bradycardia, unspecified: Secondary | ICD-10-CM | POA: Diagnosis not present

## 2023-06-09 DIAGNOSIS — I2510 Atherosclerotic heart disease of native coronary artery without angina pectoris: Secondary | ICD-10-CM | POA: Diagnosis not present

## 2023-06-09 DIAGNOSIS — D62 Acute posthemorrhagic anemia: Secondary | ICD-10-CM | POA: Diagnosis not present

## 2023-06-09 DIAGNOSIS — J90 Pleural effusion, not elsewhere classified: Secondary | ICD-10-CM | POA: Diagnosis not present

## 2023-06-09 DIAGNOSIS — Z951 Presence of aortocoronary bypass graft: Secondary | ICD-10-CM | POA: Diagnosis not present

## 2023-06-09 DIAGNOSIS — Z7982 Long term (current) use of aspirin: Secondary | ICD-10-CM | POA: Diagnosis not present

## 2023-06-09 DIAGNOSIS — K219 Gastro-esophageal reflux disease without esophagitis: Secondary | ICD-10-CM | POA: Diagnosis not present

## 2023-06-09 DIAGNOSIS — I5022 Chronic systolic (congestive) heart failure: Secondary | ICD-10-CM | POA: Diagnosis not present

## 2023-06-09 DIAGNOSIS — R570 Cardiogenic shock: Secondary | ICD-10-CM | POA: Diagnosis not present

## 2023-06-09 DIAGNOSIS — I517 Cardiomegaly: Secondary | ICD-10-CM | POA: Diagnosis not present

## 2023-06-09 DIAGNOSIS — J951 Acute pulmonary insufficiency following thoracic surgery: Secondary | ICD-10-CM | POA: Diagnosis not present

## 2023-06-09 DIAGNOSIS — D72828 Other elevated white blood cell count: Secondary | ICD-10-CM | POA: Diagnosis not present

## 2023-06-09 DIAGNOSIS — Z452 Encounter for adjustment and management of vascular access device: Secondary | ICD-10-CM | POA: Diagnosis not present

## 2023-06-09 DIAGNOSIS — D696 Thrombocytopenia, unspecified: Secondary | ICD-10-CM | POA: Diagnosis not present

## 2023-06-09 DIAGNOSIS — E669 Obesity, unspecified: Secondary | ICD-10-CM | POA: Diagnosis not present

## 2023-06-09 DIAGNOSIS — I110 Hypertensive heart disease with heart failure: Secondary | ICD-10-CM | POA: Diagnosis not present

## 2023-06-09 DIAGNOSIS — E8721 Acute metabolic acidosis: Secondary | ICD-10-CM | POA: Diagnosis not present

## 2023-06-09 DIAGNOSIS — R339 Retention of urine, unspecified: Secondary | ICD-10-CM | POA: Diagnosis not present

## 2023-06-09 DIAGNOSIS — Z6831 Body mass index (BMI) 31.0-31.9, adult: Secondary | ICD-10-CM | POA: Diagnosis not present

## 2023-06-09 DIAGNOSIS — I25810 Atherosclerosis of coronary artery bypass graft(s) without angina pectoris: Secondary | ICD-10-CM | POA: Diagnosis not present

## 2023-06-09 DIAGNOSIS — I252 Old myocardial infarction: Secondary | ICD-10-CM | POA: Diagnosis not present

## 2023-06-09 DIAGNOSIS — R54 Age-related physical debility: Secondary | ICD-10-CM | POA: Diagnosis not present

## 2023-06-09 DIAGNOSIS — T50905A Adverse effect of unspecified drugs, medicaments and biological substances, initial encounter: Secondary | ICD-10-CM | POA: Diagnosis not present

## 2023-06-09 DIAGNOSIS — J9811 Atelectasis: Secondary | ICD-10-CM | POA: Diagnosis not present

## 2023-06-10 DIAGNOSIS — Z9889 Other specified postprocedural states: Secondary | ICD-10-CM | POA: Diagnosis not present

## 2023-06-10 DIAGNOSIS — J9811 Atelectasis: Secondary | ICD-10-CM | POA: Diagnosis not present

## 2023-06-25 DIAGNOSIS — K219 Gastro-esophageal reflux disease without esophagitis: Secondary | ICD-10-CM | POA: Diagnosis not present

## 2023-06-25 DIAGNOSIS — I110 Hypertensive heart disease with heart failure: Secondary | ICD-10-CM | POA: Diagnosis not present

## 2023-06-25 DIAGNOSIS — D62 Acute posthemorrhagic anemia: Secondary | ICD-10-CM | POA: Diagnosis not present

## 2023-06-25 DIAGNOSIS — Z7984 Long term (current) use of oral hypoglycemic drugs: Secondary | ICD-10-CM | POA: Diagnosis not present

## 2023-06-25 DIAGNOSIS — I25118 Atherosclerotic heart disease of native coronary artery with other forms of angina pectoris: Secondary | ICD-10-CM | POA: Diagnosis not present

## 2023-06-25 DIAGNOSIS — Z951 Presence of aortocoronary bypass graft: Secondary | ICD-10-CM | POA: Diagnosis not present

## 2023-06-25 DIAGNOSIS — E7800 Pure hypercholesterolemia, unspecified: Secondary | ICD-10-CM | POA: Diagnosis not present

## 2023-06-25 DIAGNOSIS — I5022 Chronic systolic (congestive) heart failure: Secondary | ICD-10-CM | POA: Diagnosis not present

## 2023-06-25 DIAGNOSIS — E119 Type 2 diabetes mellitus without complications: Secondary | ICD-10-CM | POA: Diagnosis not present

## 2023-06-25 DIAGNOSIS — Z48812 Encounter for surgical aftercare following surgery on the circulatory system: Secondary | ICD-10-CM | POA: Diagnosis not present

## 2023-06-25 DIAGNOSIS — Z9181 History of falling: Secondary | ICD-10-CM | POA: Diagnosis not present

## 2023-06-25 DIAGNOSIS — Z7982 Long term (current) use of aspirin: Secondary | ICD-10-CM | POA: Diagnosis not present

## 2023-06-25 DIAGNOSIS — Z7902 Long term (current) use of antithrombotics/antiplatelets: Secondary | ICD-10-CM | POA: Diagnosis not present

## 2023-06-25 DIAGNOSIS — Z556 Problems related to health literacy: Secondary | ICD-10-CM | POA: Diagnosis not present

## 2023-06-25 DIAGNOSIS — I252 Old myocardial infarction: Secondary | ICD-10-CM | POA: Diagnosis not present

## 2023-07-06 DIAGNOSIS — I5022 Chronic systolic (congestive) heart failure: Secondary | ICD-10-CM | POA: Diagnosis not present

## 2023-07-06 DIAGNOSIS — Z885 Allergy status to narcotic agent status: Secondary | ICD-10-CM | POA: Diagnosis not present

## 2023-07-06 DIAGNOSIS — I4510 Unspecified right bundle-branch block: Secondary | ICD-10-CM | POA: Diagnosis not present

## 2023-07-06 DIAGNOSIS — R9431 Abnormal electrocardiogram [ECG] [EKG]: Secondary | ICD-10-CM | POA: Diagnosis not present

## 2023-07-06 DIAGNOSIS — Z7902 Long term (current) use of antithrombotics/antiplatelets: Secondary | ICD-10-CM | POA: Diagnosis not present

## 2023-07-06 DIAGNOSIS — Z951 Presence of aortocoronary bypass graft: Secondary | ICD-10-CM | POA: Diagnosis not present

## 2023-07-06 DIAGNOSIS — E1159 Type 2 diabetes mellitus with other circulatory complications: Secondary | ICD-10-CM | POA: Diagnosis not present

## 2023-07-06 DIAGNOSIS — Z7982 Long term (current) use of aspirin: Secondary | ICD-10-CM | POA: Diagnosis not present

## 2023-07-06 DIAGNOSIS — Z7984 Long term (current) use of oral hypoglycemic drugs: Secondary | ICD-10-CM | POA: Diagnosis not present

## 2023-07-06 DIAGNOSIS — I2510 Atherosclerotic heart disease of native coronary artery without angina pectoris: Secondary | ICD-10-CM | POA: Diagnosis not present

## 2023-07-06 DIAGNOSIS — I110 Hypertensive heart disease with heart failure: Secondary | ICD-10-CM | POA: Diagnosis not present

## 2023-07-06 DIAGNOSIS — Z888 Allergy status to other drugs, medicaments and biological substances status: Secondary | ICD-10-CM | POA: Diagnosis not present

## 2023-07-06 DIAGNOSIS — Z9889 Other specified postprocedural states: Secondary | ICD-10-CM | POA: Diagnosis not present

## 2023-07-07 DIAGNOSIS — I2510 Atherosclerotic heart disease of native coronary artery without angina pectoris: Secondary | ICD-10-CM | POA: Diagnosis not present

## 2023-08-02 DIAGNOSIS — Z79899 Other long term (current) drug therapy: Secondary | ICD-10-CM | POA: Diagnosis not present

## 2023-08-02 DIAGNOSIS — E7800 Pure hypercholesterolemia, unspecified: Secondary | ICD-10-CM | POA: Diagnosis not present

## 2023-08-02 DIAGNOSIS — E119 Type 2 diabetes mellitus without complications: Secondary | ICD-10-CM | POA: Diagnosis not present

## 2023-08-09 DIAGNOSIS — E7800 Pure hypercholesterolemia, unspecified: Secondary | ICD-10-CM | POA: Diagnosis not present

## 2023-08-09 DIAGNOSIS — I5020 Unspecified systolic (congestive) heart failure: Secondary | ICD-10-CM | POA: Diagnosis not present

## 2023-08-09 DIAGNOSIS — I25118 Atherosclerotic heart disease of native coronary artery with other forms of angina pectoris: Secondary | ICD-10-CM | POA: Diagnosis not present

## 2023-08-09 DIAGNOSIS — I152 Hypertension secondary to endocrine disorders: Secondary | ICD-10-CM | POA: Diagnosis not present

## 2023-08-09 DIAGNOSIS — E1159 Type 2 diabetes mellitus with other circulatory complications: Secondary | ICD-10-CM | POA: Diagnosis not present

## 2023-08-09 DIAGNOSIS — Z79899 Other long term (current) drug therapy: Secondary | ICD-10-CM | POA: Diagnosis not present

## 2023-08-12 ENCOUNTER — Other Ambulatory Visit: Payer: Self-pay

## 2023-08-12 ENCOUNTER — Encounter: Payer: PPO | Attending: Internal Medicine

## 2023-08-12 DIAGNOSIS — Z951 Presence of aortocoronary bypass graft: Secondary | ICD-10-CM

## 2023-08-12 DIAGNOSIS — I25110 Atherosclerotic heart disease of native coronary artery with unstable angina pectoris: Secondary | ICD-10-CM | POA: Insufficient documentation

## 2023-08-12 DIAGNOSIS — I252 Old myocardial infarction: Secondary | ICD-10-CM | POA: Insufficient documentation

## 2023-08-12 DIAGNOSIS — Z48812 Encounter for surgical aftercare following surgery on the circulatory system: Secondary | ICD-10-CM | POA: Insufficient documentation

## 2023-08-12 NOTE — Progress Notes (Signed)
Virtual Visit completed. Patient informed on EP and RD appointment and 6 Minute walk test. Patient also informed of patient health questionnaires on My Chart. Patient Verbalizes understanding. Visit diagnosis can be found in [facility] 07/06/2023.

## 2023-08-16 ENCOUNTER — Encounter: Payer: PPO | Admitting: *Deleted

## 2023-08-16 VITALS — Ht 60.0 in | Wt 154.0 lb

## 2023-08-16 DIAGNOSIS — I252 Old myocardial infarction: Secondary | ICD-10-CM | POA: Diagnosis not present

## 2023-08-16 DIAGNOSIS — Z951 Presence of aortocoronary bypass graft: Secondary | ICD-10-CM

## 2023-08-16 DIAGNOSIS — Z48812 Encounter for surgical aftercare following surgery on the circulatory system: Secondary | ICD-10-CM | POA: Diagnosis not present

## 2023-08-16 DIAGNOSIS — I25110 Atherosclerotic heart disease of native coronary artery with unstable angina pectoris: Secondary | ICD-10-CM | POA: Diagnosis not present

## 2023-08-16 NOTE — Patient Instructions (Signed)
Patient Instructions  Patient Details  Name: Rebekah Gibson MRN: [id number] Date of Birth: 12/16/1945 Referring Provider:  Rosana Hoes*  Below are your personal goals for exercise, nutrition, and risk factors. Our goal is to help you stay on track towards obtaining and maintaining these goals. We will be discussing your progress on these goals with you throughout the program.  Initial Exercise Prescription:  Initial Exercise Prescription - 08/16/23 1500       Date of Initial Exercise RX and Referring Provider   Date 08/16/23    Referring Provider Dr. Trixie Rude      Oxygen   Maintain Oxygen Saturation 88% or higher      Treadmill   MPH 1.5    Grade 0    Minutes 15    METs 2.15      Recumbant Bike   Level 2    RPM 50    Watts 15    Minutes 15    METs 2.12      REL-XR   Level 2    Speed 50    Minutes 15    METs 2.12      T5 Nustep   Level 2    SPM 80    Minutes 15    METs 2.12      Biostep-RELP   Level 2    SPM 50    Minutes 15    METs 2.12      Track   Laps 20    Minutes 15    METs 2.09      Prescription Details   Frequency (times per week) 3    Duration Progress to 30 minutes of continuous aerobic without signs/symptoms of physical distress      Intensity   THRR 40-80% of Max Heartrate 100-129    Ratings of Perceived Exertion 11-13    Perceived Dyspnea 0-4      Progression   Progression Continue to progress workloads to maintain intensity without signs/symptoms of physical distress.      Resistance Training   Training Prescription Yes    Weight 3    Reps 10-15             Exercise Goals: Frequency: Be able to perform aerobic exercise two to three times per week in program working toward 2-5 days per week of home exercise.  Intensity: Work with a perceived exertion of 11 (fairly light) - 15 (hard) while following your exercise prescription.  We will make changes to your prescription with you as you progress through the  program.   Duration: Be able to do 30 to 45 minutes of continuous aerobic exercise in addition to a 5 minute warm-up and a 5 minute cool-down routine.   Nutrition Goals: Your personal nutrition goals will be established when you do your nutrition analysis with the dietician.  The following are general nutrition guidelines to follow: Cholesterol < 200mg /day Sodium < 1500mg /day Fiber: Women over 50 yrs - 21 grams per day  Personal Goals:  Personal Goals and Risk Factors at Admission - 08/16/23 1546       Core Components/Risk Factors/Patient Goals on Admission    Weight Management Yes;Weight Loss    Intervention Weight Management: Develop a combined nutrition and exercise program designed to reach desired caloric intake, while maintaining appropriate intake of nutrient and fiber, sodium and fats, and appropriate energy expenditure required for the weight goal.;Weight Management: Provide education and appropriate resources to help participant work on and attain dietary  goals.;Weight Management/Obesity: Establish reasonable short term and long term weight goals.    Admit Weight 154 lb (69.9 kg)    Goal Weight: Short Term 148 lb (67.1 kg)    Goal Weight: Long Term 140 lb (63.5 kg)    Expected Outcomes Short Term: Continue to assess and modify interventions until short term weight is achieved;Long Term: Adherence to nutrition and physical activity/exercise program aimed toward attainment of established weight goal;Weight Loss: Understanding of general recommendations for a balanced deficit meal plan, which promotes 1-2 lb weight loss per week and includes a negative energy balance of [phone] kcal/d;Understanding recommendations for meals to include 15-35% energy as protein, 25-35% energy from fat, 35-60% energy from carbohydrates, less than 200mg  of dietary cholesterol, 20-35 gm of total fiber daily;Understanding of distribution of calorie intake throughout the day with the consumption of 4-5  meals/snacks    Diabetes Yes    Intervention Provide education about signs/symptoms and action to take for hypo/hyperglycemia.;Provide education about proper nutrition, including hydration, and aerobic/resistive exercise prescription along with prescribed medications to achieve blood glucose in normal ranges: Fasting glucose 65-99 mg/dL    Expected Outcomes Short Term: Participant verbalizes understanding of the signs/symptoms and immediate care of hyper/hypoglycemia, proper foot care and importance of medication, aerobic/resistive exercise and nutrition plan for blood glucose control.;Long Term: Attainment of HbA1C < 7%.    Heart Failure Yes    Intervention Provide a combined exercise and nutrition program that is supplemented with education, support and counseling about heart failure. Directed toward relieving symptoms such as shortness of breath, decreased exercise tolerance, and extremity edema.    Expected Outcomes Improve functional capacity of life;Short term: Attendance in program 2-3 days a week with increased exercise capacity. Reported lower sodium intake. Reported increased fruit and vegetable intake. Reports medication compliance.;Short term: Daily weights obtained and reported for increase. Utilizing diuretic protocols set by physician.;Long term: Adoption of self-care skills and reduction of barriers for early signs and symptoms recognition and intervention leading to self-care maintenance.    Hypertension Yes    Intervention Provide education on lifestyle modifcations including regular physical activity/exercise, weight management, moderate sodium restriction and increased consumption of fresh fruit, vegetables, and low fat dairy, alcohol moderation, and smoking cessation.;Monitor prescription use compliance.    Expected Outcomes Short Term: Continued assessment and intervention until BP is < 140/51mm HG in hypertensive participants. < 130/59mm HG in hypertensive participants with diabetes,  heart failure or chronic kidney disease.;Long Term: Maintenance of blood pressure at goal levels.    Lipids Yes    Intervention Provide education and support for participant on nutrition & aerobic/resistive exercise along with prescribed medications to achieve LDL 70mg , HDL >40mg .    Expected Outcomes Short Term: Participant states understanding of desired cholesterol values and is compliant with medications prescribed. Participant is following exercise prescription and nutrition guidelines.;Long Term: Cholesterol controlled with medications as prescribed, with individualized exercise RX and with personalized nutrition plan. Value goals: LDL < 70mg , HDL > 40 mg.             Tobacco Use Initial Evaluation: Social History   Tobacco Use  Smoking Status Never  Smokeless Tobacco Never    Exercise Goals and Review:  Exercise Goals     Row Name 08/16/23 1536             Exercise Goals   Increase Physical Activity Yes       Intervention Provide advice, education, support and counseling about physical activity/exercise needs.;Develop an individualized  exercise prescription for aerobic and resistive training based on initial evaluation findings, risk stratification, comorbidities and participant's personal goals.       Expected Outcomes Short Term: Attend rehab on a regular basis to increase amount of physical activity.;Long Term: Add in home exercise to make exercise part of routine and to increase amount of physical activity.;Long Term: Exercising regularly at least 3-5 days a week.       Increase Strength and Stamina Yes       Intervention Provide advice, education, support and counseling about physical activity/exercise needs.;Develop an individualized exercise prescription for aerobic and resistive training based on initial evaluation findings, risk stratification, comorbidities and participant's personal goals.       Expected Outcomes Short Term: Increase workloads from initial  exercise prescription for resistance, speed, and METs.;Short Term: Perform resistance training exercises routinely during rehab and add in resistance training at home;Long Term: Improve cardiorespiratory fitness, muscular endurance and strength as measured by increased METs and functional capacity ( )       Able to understand and use rate of perceived exertion (RPE) scale Yes       Intervention Provide education and explanation on how to use RPE scale       Expected Outcomes Short Term: Able to use RPE daily in rehab to express subjective intensity level;Long Term:  Able to use RPE to guide intensity level when exercising independently       Able to understand and use Dyspnea scale Yes       Intervention Provide education and explanation on how to use Dyspnea scale       Expected Outcomes Short Term: Able to use Dyspnea scale daily in rehab to express subjective sense of shortness of breath during exertion;Long Term: Able to use Dyspnea scale to guide intensity level when exercising independently       Knowledge and understanding of Target Heart Rate Range (THRR) Yes       Intervention Provide education and explanation of THRR including how the numbers were predicted and where they are located for reference       Expected Outcomes Short Term: Able to state/look up THRR;Long Term: Able to use THRR to govern intensity when exercising independently;Short Term: Able to use daily as guideline for intensity in rehab       Able to check pulse independently Yes       Intervention Provide education and demonstration on how to check pulse in carotid and radial arteries.;Review the importance of being able to check your own pulse for safety during independent exercise       Expected Outcomes Short Term: Able to explain why pulse checking is important during independent exercise;Long Term: Able to check pulse independently and accurately       Understanding of Exercise Prescription Yes       Intervention  Provide education, explanation, and written materials on patient's individual exercise prescription       Expected Outcomes Short Term: Able to explain program exercise prescription;Long Term: Able to explain home exercise prescription to exercise independently                Copy of goals given to participant.

## 2023-08-16 NOTE — Progress Notes (Signed)
Cardiac Individual Treatment Plan  Patient Details  Name: Rebekah Gibson MRN: [id number] Date of Birth: 1946-09-10 Referring Provider:   Flowsheet Row Cardiac Rehab from 08/16/2023 in [facility] Cardiac and Pulmonary Rehab  Referring Provider Dr. Trixie Rude       Initial Encounter Date:  Flowsheet Row Cardiac Rehab from 08/16/2023 in [facility] Cardiac and Pulmonary Rehab  Date 08/16/23       Visit Diagnosis: S/P CABG x 2  Patient's Home Medications on Admission:  Current Outpatient Medications:    aspirin 81 MG chewable tablet, Chew by mouth., Disp: , Rfl:    atorvastatin (LIPITOR) 20 MG tablet, Take 1 tablet (20 mg total) by mouth at bedtime., Disp: , Rfl:    Cayenne 450 MG CAPS, Take 1 capsule by mouth as directed. (Patient not taking: Reported on 08/12/2023), Disp: , Rfl:    chlorhexidine (PERIDEX) 0.12 % solution, SMARTSIG:5-10 Milliliter(s) By Mouth Morning-Night (Patient not taking: Reported on 08/12/2023), Disp: , Rfl:    cholecalciferol (VITAMIN D3) 25 MCG (1000 UNIT) tablet, Take 1,000 Units by mouth daily., Disp: , Rfl:    Cinnamon 500 MG capsule, Take by mouth. (Patient not taking: Reported on 08/12/2023), Disp: , Rfl:    Cinnamon 500 MG TABS, Take 1 tablet by mouth as directed., Disp: , Rfl:    Cranberry 400 MG CAPS, Take 1 capsule by mouth as directed. (Patient not taking: Reported on 08/12/2023), Disp: , Rfl:    Cranberry 425 MG CAPS, Take by mouth., Disp: , Rfl:    diphenhydrAMINE (BENADRYL) 25 MG tablet, Take 25 mg by mouth every 6 (six) hours as needed (dizziness)., Disp: , Rfl:    empagliflozin (JARDIANCE) 10 MG TABS tablet, Take 10 mg by mouth daily., Disp: , Rfl:    ENTRESTO 49-51 MG, Take 1 tablet by mouth 2 (two) times daily., Disp: , Rfl:    Ginkgo Biloba 500 MG CAPS, Take 2,000 mg by mouth as directed. (Patient not taking: Reported on 08/12/2023), Disp: , Rfl:    heparin 25366 UT/250ML infusion, Inject 1,100 Units/hr into the vein continuous. (Patient not taking:  Reported on 08/12/2023), Disp: , Rfl:    insulin aspart (NOVOLOG) 100 UNIT/ML injection, Inject 0-15 Units into the skin 3 (three) times daily with meals. (Patient not taking: Reported on 08/12/2023), Disp: , Rfl:    Magnesium 500 MG TABS, Take 1 tablet by mouth as directed., Disp: , Rfl:    metoprolol succinate (TOPROL-XL) 50 MG 24 hr tablet, Take 50 mg by mouth daily., Disp: , Rfl:    Morphine Sulfate (MORPHINE, PF,) 2 MG/ML injection, Inject 1 mL (2 mg total) into the vein every 3 (three) hours as needed. (Patient not taking: Reported on 08/12/2023), Disp: 1 mL, Rfl: 0   olmesartan-hydrochlorothiazide (BENICAR HCT) 40-12.5 MG tablet, Take 1 tablet by mouth daily. (Patient not taking: Reported on 08/12/2023), Disp: , Rfl:   Past Medical History: Past Medical History:  Diagnosis Date   EKG, abnormal    Female bladder prolapse    Frequency of urination    History of UTI    History of vertigo    Hypertension    Nocturia    Pre-diabetes    DIET CONTROLLED    Tobacco Use: Social History   Tobacco Use  Smoking Status Never  Smokeless Tobacco Never    Labs: Review Flowsheet        No data to display           Exercise Target Goals: Exercise Program Goal:  Individual exercise prescription set using results from initial 6 min walk test and THRR while considering  patient's activity barriers and safety.   Exercise Prescription Goal: Initial exercise prescription builds to 30-45 minutes a day of aerobic activity, 2-3 days per week.  Home exercise guidelines will be given to patient during program as part of exercise prescription that the participant will acknowledge.   Education: Aerobic Exercise: - Group verbal and visual presentation on the components of exercise prescription. Introduces F.I.T.T principle from ACSM for exercise prescriptions.  Reviews F.I.T.T. principles of aerobic exercise including progression. Written material given at graduation. Flowsheet Row Cardiac Rehab  from 08/16/2023 in [facility] Cardiac and Pulmonary Rehab  Education need identified 08/16/23       Education: Resistance Exercise: - Group verbal and visual presentation on the components of exercise prescription. Introduces F.I.T.T principle from ACSM for exercise prescriptions  Reviews F.I.T.T. principles of resistance exercise including progression. Written material given at graduation.    Education: Exercise & Equipment Safety: - Individual verbal instruction and demonstration of equipment use and safety with use of the equipment. Flowsheet Row Cardiac Rehab from 08/16/2023 in [facility] Cardiac and Pulmonary Rehab  Date 08/16/23  Educator [facility]  Instruction Review Code 1- Verbalizes Understanding       Education: Exercise Physiology & General Exercise Guidelines: - Group verbal and written instruction with models to review the exercise physiology of the cardiovascular system and associated critical values. Provides general exercise guidelines with specific guidelines to those with heart or lung disease.    Education: Flexibility, Balance, Mind/Body Relaxation: - Group verbal and visual presentation with interactive activity on the components of exercise prescription. Introduces F.I.T.T principle from ACSM for exercise prescriptions. Reviews F.I.T.T. principles of flexibility and balance exercise training including progression. Also discusses the mind body connection.  Reviews various relaxation techniques to help reduce and manage stress (i.e. Deep breathing, progressive muscle relaxation, and visualization). Balance handout provided to take home. Written material given at graduation.   Activity Barriers & Risk Stratification:  Activity Barriers & Cardiac Risk Stratification - 08/16/23 1527       Activity Barriers & Cardiac Risk Stratification   Activity Barriers Other (comment)    Comments spells of verdigo    Cardiac Risk Stratification High             6 Minute Walk:  6 Minute  Walk     Row Name 08/16/23 1526         6 Minute Walk   Phase Initial     Distance 1150 feet     Walk Time 6 minutes     # of Rest Breaks 0     MPH 2.18     METS 2.12     RPE 15     Perceived Dyspnea  1     VO2 Peak 7.42     Symptoms No     Resting HR 71 bpm     Resting BP 138/70     Resting Oxygen Saturation  95 %     Exercise Oxygen Saturation  during 6 min walk 93 %     Max Ex. HR 99 bpm     Max Ex. BP 142/82     2 Minute Post BP 132/80              Oxygen Initial Assessment:   Oxygen Re-Evaluation:   Oxygen Discharge (Final Oxygen Re-Evaluation):   Initial Exercise Prescription:  Initial Exercise Prescription - 08/16/23 1500  Date of Initial Exercise RX and Referring Provider   Date 08/16/23    Referring Provider Dr. Trixie Rude      Oxygen   Maintain Oxygen Saturation 88% or higher      Treadmill   MPH 1.5    Grade 0    Minutes 15    METs 2.15      Recumbant Bike   Level 2    RPM 50    Watts 15    Minutes 15    METs 2.12      REL-XR   Level 2    Speed 50    Minutes 15    METs 2.12      T5 Nustep   Level 2    SPM 80    Minutes 15    METs 2.12      Biostep-RELP   Level 2    SPM 50    Minutes 15    METs 2.12      Track   Laps 20    Minutes 15    METs 2.09      Prescription Details   Frequency (times per week) 3    Duration Progress to 30 minutes of continuous aerobic without signs/symptoms of physical distress      Intensity   THRR 40-80% of Max Heartrate 100-129    Ratings of Perceived Exertion 11-13    Perceived Dyspnea 0-4      Progression   Progression Continue to progress workloads to maintain intensity without signs/symptoms of physical distress.      Resistance Training   Training Prescription Yes    Weight 3    Reps 10-15             Perform Capillary Blood Glucose checks as needed.  Exercise Prescription Changes:   Exercise Prescription Changes     Row Name 08/16/23 1500              Response to Exercise   Blood Pressure (Admit) 138/70       Blood Pressure (Exercise) 142/82       Blood Pressure (Exit) 132/80       Heart Rate (Admit) 71 bpm       Heart Rate (Exercise) 99 bpm       Heart Rate (Exit) 81 bpm       Oxygen Saturation (Admit) 95 %       Oxygen Saturation (Exercise) 93 %       Oxygen Saturation (Exit) 95 %       Rating of Perceived Exertion (Exercise) 15       Perceived Dyspnea (Exercise) 1       Symptoms none       Comments 6 MWT results                Exercise Comments:   Exercise Goals and Review:   Exercise Goals     Row Name 08/16/23 1536             Exercise Goals   Increase Physical Activity Yes       Intervention Provide advice, education, support and counseling about physical activity/exercise needs.;Develop an individualized exercise prescription for aerobic and resistive training based on initial evaluation findings, risk stratification, comorbidities and participant's personal goals.       Expected Outcomes Short Term: Attend rehab on a regular basis to increase amount of physical activity.;Long Term: Add in home exercise to make exercise part of routine and to increase  amount of physical activity.;Long Term: Exercising regularly at least 3-5 days a week.       Increase Strength and Stamina Yes       Intervention Provide advice, education, support and counseling about physical activity/exercise needs.;Develop an individualized exercise prescription for aerobic and resistive training based on initial evaluation findings, risk stratification, comorbidities and participant's personal goals.       Expected Outcomes Short Term: Increase workloads from initial exercise prescription for resistance, speed, and METs.;Short Term: Perform resistance training exercises routinely during rehab and add in resistance training at home;Long Term: Improve cardiorespiratory fitness, muscular endurance and strength as measured by increased METs and  functional capacity ( )       Able to understand and use rate of perceived exertion (RPE) scale Yes       Intervention Provide education and explanation on how to use RPE scale       Expected Outcomes Short Term: Able to use RPE daily in rehab to express subjective intensity level;Long Term:  Able to use RPE to guide intensity level when exercising independently       Able to understand and use Dyspnea scale Yes       Intervention Provide education and explanation on how to use Dyspnea scale       Expected Outcomes Short Term: Able to use Dyspnea scale daily in rehab to express subjective sense of shortness of breath during exertion;Long Term: Able to use Dyspnea scale to guide intensity level when exercising independently       Knowledge and understanding of Target Heart Rate Range (THRR) Yes       Intervention Provide education and explanation of THRR including how the numbers were predicted and where they are located for reference       Expected Outcomes Short Term: Able to state/look up THRR;Long Term: Able to use THRR to govern intensity when exercising independently;Short Term: Able to use daily as guideline for intensity in rehab       Able to check pulse independently Yes       Intervention Provide education and demonstration on how to check pulse in carotid and radial arteries.;Review the importance of being able to check your own pulse for safety during independent exercise       Expected Outcomes Short Term: Able to explain why pulse checking is important during independent exercise;Long Term: Able to check pulse independently and accurately       Understanding of Exercise Prescription Yes       Intervention Provide education, explanation, and written materials on patient's individual exercise prescription       Expected Outcomes Short Term: Able to explain program exercise prescription;Long Term: Able to explain home exercise prescription to exercise independently                 Exercise Goals Re-Evaluation :   Discharge Exercise Prescription (Final Exercise Prescription Changes):  Exercise Prescription Changes - 08/16/23 1500       Response to Exercise   Blood Pressure (Admit) 138/70    Blood Pressure (Exercise) 142/82    Blood Pressure (Exit) 132/80    Heart Rate (Admit) 71 bpm    Heart Rate (Exercise) 99 bpm    Heart Rate (Exit) 81 bpm    Oxygen Saturation (Admit) 95 %    Oxygen Saturation (Exercise) 93 %    Oxygen Saturation (Exit) 95 %    Rating of Perceived Exertion (Exercise) 15    Perceived Dyspnea (Exercise) 1  Symptoms none    Comments 6 MWT results             Nutrition:  Target Goals: Understanding of nutrition guidelines, daily intake of sodium 1500mg , cholesterol 200mg , calories 30% from fat and 7% or less from saturated fats, daily to have 5 or more servings of fruits and vegetables.  Education: All About Nutrition: -Group instruction provided by verbal, written material, interactive activities, discussions, models, and posters to present general guidelines for heart healthy nutrition including fat, fiber, MyPlate, the role of sodium in heart healthy nutrition, utilization of the nutrition label, and utilization of this knowledge for meal planning. Follow up email sent as well. Written material given at graduation.   Biometrics:  Pre Biometrics - 08/16/23 1542       Pre Biometrics   Height 5' (1.524 m)    Weight 154 lb (69.9 kg)    Waist Circumference 40 inches    Hip Circumference 44.5 inches    Waist to Hip Ratio 0.9 %    BMI (Calculated) 30.08    Single Leg Stand 4.66 seconds              Nutrition Therapy Plan and Nutrition Goals:   Nutrition Assessments:  MEDIFICTS Score Key: >=70 Need to make dietary changes  40-70 Heart Healthy Diet <= 40 Therapeutic Level Cholesterol Diet  Flowsheet Row Cardiac Rehab from 08/16/2023 in [facility] Cardiac and Pulmonary Rehab  Picture Your Plate Total Score on  Admission 75      Picture Your Plate Scores: <01 Unhealthy dietary pattern with much room for improvement. 41-50 Dietary pattern unlikely to meet recommendations for good health and room for improvement. 51-60 More healthful dietary pattern, with some room for improvement.  >60 Healthy dietary pattern, although there may be some specific behaviors that could be improved.    Nutrition Goals Re-Evaluation:   Nutrition Goals Discharge (Final Nutrition Goals Re-Evaluation):   Psychosocial: Target Goals: Acknowledge presence or absence of significant depression and/or stress, maximize coping skills, provide positive support system. Participant is able to verbalize types and ability to use techniques and skills needed for reducing stress and depression.   Education: Stress, Anxiety, and Depression - Group verbal and visual presentation to define topics covered.  Reviews how body is impacted by stress, anxiety, and depression.  Also discusses healthy ways to reduce stress and to treat/manage anxiety and depression.  Written material given at graduation.   Education: Sleep Hygiene -Provides group verbal and written instruction about how sleep can affect your health.  Define sleep hygiene, discuss sleep cycles and impact of sleep habits. Review good sleep hygiene tips.    Initial Review & Psychosocial Screening:  Initial Psych Review & Screening - 08/12/23 1017       Initial Review   Current issues with None Identified      Family Dynamics   Good Support System? Yes    Comments Ellec has a good family support system and can look to her five children for support. She does not take any medications for her mood.      Barriers   Psychosocial barriers to participate in program The patient should benefit from training in stress management and relaxation.;There are no identifiable barriers or psychosocial needs.      Screening Interventions   Interventions To provide support and resources  with identified psychosocial needs;Encouraged to exercise;Provide feedback about the scores to participant    Expected Outcomes Short Term goal: Utilizing psychosocial counselor, staff and physician to  assist with identification of specific Stressors or current issues interfering with healing process. Setting desired goal for each stressor or current issue identified.;Long Term Goal: Stressors or current issues are controlled or eliminated.;Short Term goal: Identification and review with participant of any Quality of Life or Depression concerns found by scoring the questionnaire.;Long Term goal: The participant improves quality of Life and PHQ9 Scores as seen by post scores and/or verbalization of changes             Quality of Life Scores:   Quality of Life - 08/16/23 1545       Quality of Life   Select Quality of Life      Quality of Life Scores   Health/Function Pre 23.9 %    Socioeconomic Pre 22.5 %    Psych/Spiritual Pre 24.29 %    Family Pre 25.8 %    GLOBAL Pre 23.93 %            Scores of 19 and below usually indicate a poorer quality of life in these areas.  A difference of  2-3 points is a clinically meaningful difference.  A difference of 2-3 points in the total score of the Quality of Life Index has been associated with significant improvement in overall quality of life, self-image, physical symptoms, and general health in studies assessing change in quality of life.  PHQ-9: Review Flowsheet       08/16/2023  Depression screen PHQ 2/9  Decreased Interest 0  Down, Depressed, Hopeless 0  PHQ - 2 Score 0  Altered sleeping 1  Tired, decreased energy 1  Change in appetite 0  Feeling bad or failure about yourself  0  Trouble concentrating 0  Moving slowly or fidgety/restless 0  Suicidal thoughts 0  PHQ-9 Score 2  Difficult doing work/chores Not difficult at all    Details           Interpretation of Total Score  Total Score Depression Severity:  1-4 =  Minimal depression, 5-9 = Mild depression, 10-14 = Moderate depression, 15-19 = Moderately severe depression, 20-27 = Severe depression   Psychosocial Evaluation and Intervention:  Psychosocial Evaluation - 08/12/23 1018       Psychosocial Evaluation & Interventions   Interventions Relaxation education;Stress management education;Encouraged to exercise with the program and follow exercise prescription    Comments Ellec has a good family support system and can look to her five children for support. She does not take any medications for her mood.    Expected Outcomes Short: Start HeartTrack to help with mood. Long: Maintain a healthy mental state    Continue Psychosocial Services  Follow up required by staff             Psychosocial Re-Evaluation:   Psychosocial Discharge (Final Psychosocial Re-Evaluation):   Vocational Rehabilitation: Provide vocational rehab assistance to qualifying candidates.   Vocational Rehab Evaluation & Intervention:   Education: Education Goals: Education classes will be provided on a variety of topics geared toward better understanding of heart health and risk factor modification. Participant will state understanding/return demonstration of topics presented as noted by education test scores.  Learning Barriers/Preferences:  Learning Barriers/Preferences - 08/12/23 1016       Learning Barriers/Preferences   Learning Barriers None    Learning Preferences None             General Cardiac Education Topics:  AED/CPR: - Group verbal and written instruction with the use of models to demonstrate the basic use of the  AED with the basic ABC's of resuscitation.   Anatomy and Cardiac Procedures: - Group verbal and visual presentation and models provide information about basic cardiac anatomy and function. Reviews the testing methods done to diagnose heart disease and the outcomes of the test results. Describes the treatment choices: Medical  Management, Angioplasty, or Coronary Bypass Surgery for treating various heart conditions including Myocardial Infarction, Angina, Valve Disease, and Cardiac Arrhythmias.  Written material given at graduation. Flowsheet Row Cardiac Rehab from 08/16/2023 in [facility] Cardiac and Pulmonary Rehab  Education need identified 08/16/23       Medication Safety: - Group verbal and visual instruction to review commonly prescribed medications for heart and lung disease. Reviews the medication, class of the drug, and side effects. Includes the steps to properly store meds and maintain the prescription regimen.  Written material given at graduation.   Intimacy: - Group verbal instruction through game format to discuss how heart and lung disease can affect sexual intimacy. Written material given at graduation..   Know Your Numbers and Heart Failure: - Group verbal and visual instruction to discuss disease risk factors for cardiac and pulmonary disease and treatment options.  Reviews associated critical values for Overweight/Obesity, Hypertension, Cholesterol, and Diabetes.  Discusses basics of heart failure: signs/symptoms and treatments.  Introduces Heart Failure Zone chart for action plan for heart failure.  Written material given at graduation.   Infection Prevention: - Provides verbal and written material to individual with discussion of infection control including proper hand washing and proper equipment cleaning during exercise session. Flowsheet Row Cardiac Rehab from 08/16/2023 in [facility] Cardiac and Pulmonary Rehab  Date 08/16/23  Educator [facility]  Instruction Review Code 1- Verbalizes Understanding       Falls Prevention: - Provides verbal and written material to individual with discussion of falls prevention and safety. Flowsheet Row Cardiac Rehab from 08/16/2023 in [facility] Cardiac and Pulmonary Rehab  Date 08/16/23  Educator [facility]  Instruction Review Code 1- Verbalizes Understanding        Other: -Provides group and verbal instruction on various topics (see comments)   Knowledge Questionnaire Score:  Knowledge Questionnaire Score - 08/16/23 1544       Knowledge Questionnaire Score   Pre Score 24/26             Core Components/Risk Factors/Patient Goals at Admission:  Personal Goals and Risk Factors at Admission - 08/16/23 1546       Core Components/Risk Factors/Patient Goals on Admission    Weight Management Yes;Weight Loss    Intervention Weight Management: Develop a combined nutrition and exercise program designed to reach desired caloric intake, while maintaining appropriate intake of nutrient and fiber, sodium and fats, and appropriate energy expenditure required for the weight goal.;Weight Management: Provide education and appropriate resources to help participant work on and attain dietary goals.;Weight Management/Obesity: Establish reasonable short term and long term weight goals.    Admit Weight 154 lb (69.9 kg)    Goal Weight: Short Term 148 lb (67.1 kg)    Goal Weight: Long Term 140 lb (63.5 kg)    Expected Outcomes Short Term: Continue to assess and modify interventions until short term weight is achieved;Long Term: Adherence to nutrition and physical activity/exercise program aimed toward attainment of established weight goal;Weight Loss: Understanding of general recommendations for a balanced deficit meal plan, which promotes 1-2 lb weight loss per week and includes a negative energy balance of [phone] kcal/d;Understanding recommendations for meals to include 15-35% energy as protein, 25-35% energy from fat,  35-60% energy from carbohydrates, less than 200mg  of dietary cholesterol, 20-35 gm of total fiber daily;Understanding of distribution of calorie intake throughout the day with the consumption of 4-5 meals/snacks    Diabetes Yes    Intervention Provide education about signs/symptoms and action to take for hypo/hyperglycemia.;Provide education about  proper nutrition, including hydration, and aerobic/resistive exercise prescription along with prescribed medications to achieve blood glucose in normal ranges: Fasting glucose 65-99 mg/dL    Expected Outcomes Short Term: Participant verbalizes understanding of the signs/symptoms and immediate care of hyper/hypoglycemia, proper foot care and importance of medication, aerobic/resistive exercise and nutrition plan for blood glucose control.;Long Term: Attainment of HbA1C < 7%.    Heart Failure Yes    Intervention Provide a combined exercise and nutrition program that is supplemented with education, support and counseling about heart failure. Directed toward relieving symptoms such as shortness of breath, decreased exercise tolerance, and extremity edema.    Expected Outcomes Improve functional capacity of life;Short term: Attendance in program 2-3 days a week with increased exercise capacity. Reported lower sodium intake. Reported increased fruit and vegetable intake. Reports medication compliance.;Short term: Daily weights obtained and reported for increase. Utilizing diuretic protocols set by physician.;Long term: Adoption of self-care skills and reduction of barriers for early signs and symptoms recognition and intervention leading to self-care maintenance.    Hypertension Yes    Intervention Provide education on lifestyle modifcations including regular physical activity/exercise, weight management, moderate sodium restriction and increased consumption of fresh fruit, vegetables, and low fat dairy, alcohol moderation, and smoking cessation.;Monitor prescription use compliance.    Expected Outcomes Short Term: Continued assessment and intervention until BP is < 140/64mm HG in hypertensive participants. < 130/80mm HG in hypertensive participants with diabetes, heart failure or chronic kidney disease.;Long Term: Maintenance of blood pressure at goal levels.    Lipids Yes    Intervention Provide education and  support for participant on nutrition & aerobic/resistive exercise along with prescribed medications to achieve LDL 70mg , HDL >40mg .    Expected Outcomes Short Term: Participant states understanding of desired cholesterol values and is compliant with medications prescribed. Participant is following exercise prescription and nutrition guidelines.;Long Term: Cholesterol controlled with medications as prescribed, with individualized exercise RX and with personalized nutrition plan. Value goals: LDL < 70mg , HDL > 40 mg.             Education:Diabetes - Individual verbal and written instruction to review signs/symptoms of diabetes, desired ranges of glucose level fasting, after meals and with exercise. Acknowledge that pre and post exercise glucose checks will be done for 3 sessions at entry of program. Flowsheet Row Cardiac Rehab from 08/16/2023 in [facility] Cardiac and Pul[facility] Rehab  Date 08/16/23  Educator [facility]  Instruction Review Code 1- Verbalizes Understanding       Core Components/Risk Factors/Patient Goals Review:    Core Components/Risk Factors/Patient Goals at Discharge (Final Review):    ITP Comments:  ITP Comments     Row Name 08/12/23 1015 08/16/23 1525         ITP Comments Virtual Visit completed. Patient informed on EP and RD appointment and 6 Minute walk test. Patient also informed of patient health questionnaires on My Chart. Patient Verbalizes understanding. Visit diagnosis can be found in [facility] 07/06/2023. Completed and gym orientation. Initial ITP created and sent for review to Dr. Bethann Punches, Medical Director.               Comments: initial ITP

## 2023-08-16 NOTE — Progress Notes (Signed)
Assessment start time: 2:21 PM  Digestive issues/concerns: no known food allergies  24-hours Recall: B: oatmeal or egg Snack: 1 peanut butter chocolate cup L: low sodium ham sandwich with mayo Snack: grapes D: bean salad  Beverages water (32oz), decaf coffee, diet decaf soda Alcohol none Caffeine none  Supplements B12, B complex, D3, magnesium  Intake Patterns Feels she could eat more veggies, doesn't eat out often.   Education r/t nutrition plan Patient drinking 32oz of water, no sugary or caffeinated beverages. Encouraged her to aim for 48-64oz. She typically eats 3 meals per day. Might snack on peanut butter or grapes. Educated on protein carb pairing the importance of eat eating a quality carb throughout the day. Reviewed mediterranean diet handout, educated on types of fats, sources, and how to read label. Built out several small meals and snacks with foods she likes and will eat focusing on low sodium, carb controlled foods with healthy protein.   Goal 1: Include more colorful produce, aim for 5-8 servings of fruits and veggies per day Goal 2: Eat 15-30gProtein and 30-60gCarbs at each meal. Goal 3: Read labels and reduce sodium intake to below 2300mg . Ideally 1500mg  per day.   End time 3:01 PM

## 2023-08-18 DIAGNOSIS — I10 Essential (primary) hypertension: Secondary | ICD-10-CM | POA: Diagnosis not present

## 2023-08-18 DIAGNOSIS — E7800 Pure hypercholesterolemia, unspecified: Secondary | ICD-10-CM | POA: Diagnosis not present

## 2023-08-18 DIAGNOSIS — I25110 Atherosclerotic heart disease of native coronary artery with unstable angina pectoris: Secondary | ICD-10-CM | POA: Diagnosis not present

## 2023-08-25 ENCOUNTER — Encounter: Payer: Self-pay | Admitting: *Deleted

## 2023-08-25 ENCOUNTER — Encounter: Payer: PPO | Admitting: *Deleted

## 2023-08-25 DIAGNOSIS — Z951 Presence of aortocoronary bypass graft: Secondary | ICD-10-CM

## 2023-08-25 DIAGNOSIS — I25110 Atherosclerotic heart disease of native coronary artery with unstable angina pectoris: Secondary | ICD-10-CM | POA: Diagnosis not present

## 2023-08-25 LAB — GLUCOSE, CAPILLARY
Glucose-Capillary: 130 mg/dL — ABNORMAL HIGH (ref 70–99)
Glucose-Capillary: 98 mg/dL (ref 70–99)

## 2023-08-25 NOTE — Progress Notes (Signed)
Daily Session Note  Patient Details  Name: Rebekah Gibson MRN: [id number] Date of Birth: December 01, 1945 Referring Provider:   Flowsheet Row Cardiac Rehab from 08/16/2023 in [facility] Cardiac and Pulmonary Rehab  Referring Provider Dr. Trixie Rude       Encounter Date: 08/25/2023  Check In:  Session Check In - 08/25/23 1034       Check-In   Supervising physician immediately available to respond to emergencies See telemetry face sheet for immediately available ER MD    Location [facility]-Cardiac & Pulmonary Rehab    Staff Present Rory Percy, MS, Exercise Physiologist;Joseph Reino Kent, RCP,RRT,BSRT;Maxon Conetta BS, , Exercise Physiologist;Tristen Luce Katrinka Blazing, RN, ADN;Meredith Jewel Baize, RN BSN    Virtual Visit No    Medication changes reported     No    Fall or balance concerns reported    No    Warm-up and Cool-down Performed on first and last piece of equipment    Resistance Training Performed Yes    VAD Patient? No    PAD/SET Patient? No      Pain Assessment   Currently in Pain? No/denies                Social History   Tobacco Use  Smoking Status Never  Smokeless Tobacco Never    Goals Met:  Independence with exercise equipment Exercise tolerated well No report of concerns or symptoms today Strength training completed today  Goals Unmet:  Not Applicable  Comments: First full day of exercise!  Patient was oriented to gym and equipment including functions, settings, policies, and procedures.  Patient's individual exercise prescription and treatment plan were reviewed.  All starting workloads were established based on the results of the 6 minute walk test done at initial orientation visit.  The plan for exercise progression was also introduced and progression will be customized based on patient's performance and goals.    Dr. Bethann Punches is Medical Director for [facility] Cardiac Rehabilitation.  Dr. Vida Rigger is Medical Director for [facility] Pulmonary Rehabilitation.

## 2023-08-25 NOTE — Progress Notes (Signed)
Cardiac Individual Treatment Plan  Patient Details  Name: Rebekah Gibson MRN: [id number] Date of Birth: June 08, 1946 Referring Provider:   Flowsheet Row Cardiac Rehab from 08/16/2023 in [facility] Cardiac and Pulmonary Rehab  Referring Provider Dr. Trixie Rude       Initial Encounter Date:  Flowsheet Row Cardiac Rehab from 08/16/2023 in [facility] Cardiac and Pulmonary Rehab  Date 08/16/23       Visit Diagnosis: S/P CABG x 2  Patient's Home Medications on Admission:  Current Outpatient Medications:    aspirin 81 MG chewable tablet, Chew by mouth., Disp: , Rfl:    atorvastatin (LIPITOR) 20 MG tablet, Take 1 tablet (20 mg total) by mouth at bedtime., Disp: , Rfl:    Cayenne 450 MG CAPS, Take 1 capsule by mouth as directed. (Patient not taking: Reported on 08/12/2023), Disp: , Rfl:    chlorhexidine (PERIDEX) 0.12 % solution, SMARTSIG:5-10 Milliliter(s) By Mouth Morning-Night (Patient not taking: Reported on 08/12/2023), Disp: , Rfl:    cholecalciferol (VITAMIN D3) 25 MCG (1000 UNIT) tablet, Take 1,000 Units by mouth daily., Disp: , Rfl:    Cinnamon 500 MG capsule, Take by mouth. (Patient not taking: Reported on 08/12/2023), Disp: , Rfl:    Cinnamon 500 MG TABS, Take 1 tablet by mouth as directed., Disp: , Rfl:    Cranberry 400 MG CAPS, Take 1 capsule by mouth as directed. (Patient not taking: Reported on 08/12/2023), Disp: , Rfl:    Cranberry 425 MG CAPS, Take by mouth., Disp: , Rfl:    diphenhydrAMINE (BENADRYL) 25 MG tablet, Take 25 mg by mouth every 6 (six) hours as needed (dizziness)., Disp: , Rfl:    empagliflozin (JARDIANCE) 10 MG TABS tablet, Take 10 mg by mouth daily., Disp: , Rfl:    ENTRESTO 49-51 MG, Take 1 tablet by mouth 2 (two) times daily., Disp: , Rfl:    Ginkgo Biloba 500 MG CAPS, Take 2,000 mg by mouth as directed. (Patient not taking: Reported on 08/12/2023), Disp: , Rfl:    heparin 70623 UT/250ML infusion, Inject 1,100 Units/hr into the vein continuous. (Patient not taking:  Reported on 08/12/2023), Disp: , Rfl:    insulin aspart (NOVOLOG) 100 UNIT/ML injection, Inject 0-15 Units into the skin 3 (three) times daily with meals. (Patient not taking: Reported on 08/12/2023), Disp: , Rfl:    Magnesium 500 MG TABS, Take 1 tablet by mouth as directed., Disp: , Rfl:    metoprolol succinate (TOPROL-XL) 50 MG 24 hr tablet, Take 50 mg by mouth daily., Disp: , Rfl:    Morphine Sulfate (MORPHINE, PF,) 2 MG/ML injection, Inject 1 mL (2 mg total) into the vein every 3 (three) hours as needed. (Patient not taking: Reported on 08/12/2023), Disp: 1 mL, Rfl: 0   olmesartan-hydrochlorothiazide (BENICAR HCT) 40-12.5 MG tablet, Take 1 tablet by mouth daily. (Patient not taking: Reported on 08/12/2023), Disp: , Rfl:   Past Medical History: Past Medical History:  Diagnosis Date   EKG, abnormal    Female bladder prolapse    Frequency of urination    History of UTI    History of vertigo    Hypertension    Nocturia    Pre-diabetes    DIET CONTROLLED    Tobacco Use: Social History   Tobacco Use  Smoking Status Never  Smokeless Tobacco Never    Labs: Review Flowsheet        No data to display           Exercise Target Goals: Exercise Program Goal:  Individual exercise prescription set using results from initial 6 min walk test and THRR while considering  patient's activity barriers and safety.   Exercise Prescription Goal: Initial exercise prescription builds to 30-45 minutes a day of aerobic activity, 2-3 days per week.  Home exercise guidelines will be given to patient during program as part of exercise prescription that the participant will acknowledge.   Education: Aerobic Exercise: - Group verbal and visual presentation on the components of exercise prescription. Introduces F.I.T.T principle from ACSM for exercise prescriptions.  Reviews F.I.T.T. principles of aerobic exercise including progression. Written material given at graduation. Flowsheet Row Cardiac Rehab  from 08/25/2023 in [facility] Cardiac and Pulmonary Rehab  Education need identified 08/16/23       Education: Resistance Exercise: - Group verbal and visual presentation on the components of exercise prescription. Introduces F.I.T.T principle from ACSM for exercise prescriptions  Reviews F.I.T.T. principles of resistance exercise including progression. Written material given at graduation.    Education: Exercise & Equipment Safety: - Individual verbal instruction and demonstration of equipment use and safety with use of the equipment. Flowsheet Row Cardiac Rehab from 08/25/2023 in [facility] Cardiac and Pulmonary Rehab  Date 08/16/23  Educator [facility]  Instruction Review Code 1- Verbalizes Understanding       Education: Exercise Physiology & General Exercise Guidelines: - Group verbal and written instruction with models to review the exercise physiology of the cardiovascular system and associated critical values. Provides general exercise guidelines with specific guidelines to those with heart or lung disease.    Education: Flexibility, Balance, Mind/Body Relaxation: - Group verbal and visual presentation with interactive activity on the components of exercise prescription. Introduces F.I.T.T principle from ACSM for exercise prescriptions. Reviews F.I.T.T. principles of flexibility and balance exercise training including progression. Also discusses the mind body connection.  Reviews various relaxation techniques to help reduce and manage stress (i.e. Deep breathing, progressive muscle relaxation, and visualization). Balance handout provided to take home. Written material given at graduation.   Activity Barriers & Risk Stratification:  Activity Barriers & Cardiac Risk Stratification - 08/16/23 1527       Activity Barriers & Cardiac Risk Stratification   Activity Barriers Other (comment)    Comments spells of verdigo    Cardiac Risk Stratification High             6 Minute Walk:  6 Minute  Walk     Row Name 08/16/23 1526         6 Minute Walk   Phase Initial     Distance 1150 feet     Walk Time 6 minutes     # of Rest Breaks 0     MPH 2.18     METS 2.12     RPE 15     Perceived Dyspnea  1     VO2 Peak 7.42     Symptoms No     Resting HR 71 bpm     Resting BP 138/70     Resting Oxygen Saturation  95 %     Exercise Oxygen Saturation  during 6 min walk 93 %     Max Ex. HR 99 bpm     Max Ex. BP 142/82     2 Minute Post BP 132/80              Oxygen Initial Assessment:   Oxygen Re-Evaluation:   Oxygen Discharge (Final Oxygen Re-Evaluation):   Initial Exercise Prescription:  Initial Exercise Prescription - 08/16/23 1500  Date of Initial Exercise RX and Referring Provider   Date 08/16/23    Referring Provider Dr. Trixie Rude      Oxygen   Maintain Oxygen Saturation 88% or higher      Treadmill   MPH 1.5    Grade 0    Minutes 15    METs 2.15      Recumbant Bike   Level 2    RPM 50    Watts 15    Minutes 15    METs 2.12      REL-XR   Level 2    Speed 50    Minutes 15    METs 2.12      T5 Nustep   Level 2    SPM 80    Minutes 15    METs 2.12      Biostep-RELP   Level 2    SPM 50    Minutes 15    METs 2.12      Track   Laps 20    Minutes 15    METs 2.09      Prescription Details   Frequency (times per week) 3    Duration Progress to 30 minutes of continuous aerobic without signs/symptoms of physical distress      Intensity   THRR 40-80% of Max Heartrate 100-129    Ratings of Perceived Exertion 11-13    Perceived Dyspnea 0-4      Progression   Progression Continue to progress workloads to maintain intensity without signs/symptoms of physical distress.      Resistance Training   Training Prescription Yes    Weight 3    Reps 10-15             Perform Capillary Blood Glucose checks as needed.  Exercise Prescription Changes:   Exercise Prescription Changes     Row Name 08/16/23 1500              Response to Exercise   Blood Pressure (Admit) 138/70       Blood Pressure (Exercise) 142/82       Blood Pressure (Exit) 132/80       Heart Rate (Admit) 71 bpm       Heart Rate (Exercise) 99 bpm       Heart Rate (Exit) 81 bpm       Oxygen Saturation (Admit) 95 %       Oxygen Saturation (Exercise) 93 %       Oxygen Saturation (Exit) 95 %       Rating of Perceived Exertion (Exercise) 15       Perceived Dyspnea (Exercise) 1       Symptoms none       Comments 6 MWT results                Exercise Comments:   Exercise Comments     Row Name 08/25/23 1036           Exercise Comments First full day of exercise!  Patient was oriented to gym and equipment including functions, settings, policies, and procedures.  Patient's individual exercise prescription and treatment plan were reviewed.  All starting workloads were established based on the results of the 6 minute walk test done at initial orientation visit.  The plan for exercise progression was also introduced and progression will be customized based on patient's performance and goals.                Exercise Goals and  Review:   Exercise Goals     Row Name 08/16/23 1536             Exercise Goals   Increase Physical Activity Yes       Intervention Provide advice, education, support and counseling about physical activity/exercise needs.;Develop an individualized exercise prescription for aerobic and resistive training based on initial evaluation findings, risk stratification, comorbidities and participant's personal goals.       Expected Outcomes Short Term: Attend rehab on a regular basis to increase amount of physical activity.;Long Term: Add in home exercise to make exercise part of routine and to increase amount of physical activity.;Long Term: Exercising regularly at least 3-5 days a week.       Increase Strength and Stamina Yes       Intervention Provide advice, education, support and counseling about physical  activity/exercise needs.;Develop an individualized exercise prescription for aerobic and resistive training based on initial evaluation findings, risk stratification, comorbidities and participant's personal goals.       Expected Outcomes Short Term: Increase workloads from initial exercise prescription for resistance, speed, and METs.;Short Term: Perform resistance training exercises routinely during rehab and add in resistance training at home;Long Term: Improve cardiorespiratory fitness, muscular endurance and strength as measured by increased METs and functional capacity ( )       Able to understand and use rate of perceived exertion (RPE) scale Yes       Intervention Provide education and explanation on how to use RPE scale       Expected Outcomes Short Term: Able to use RPE daily in rehab to express subjective intensity level;Long Term:  Able to use RPE to guide intensity level when exercising independently       Able to understand and use Dyspnea scale Yes       Intervention Provide education and explanation on how to use Dyspnea scale       Expected Outcomes Short Term: Able to use Dyspnea scale daily in rehab to express subjective sense of shortness of breath during exertion;Long Term: Able to use Dyspnea scale to guide intensity level when exercising independently       Knowledge and understanding of Target Heart Rate Range (THRR) Yes       Intervention Provide education and explanation of THRR including how the numbers were predicted and where they are located for reference       Expected Outcomes Short Term: Able to state/look up THRR;Long Term: Able to use THRR to govern intensity when exercising independently;Short Term: Able to use daily as guideline for intensity in rehab       Able to check pulse independently Yes       Intervention Provide education and demonstration on how to check pulse in carotid and radial arteries.;Review the importance of being able to check your own pulse for  safety during independent exercise       Expected Outcomes Short Term: Able to explain why pulse checking is important during independent exercise;Long Term: Able to check pulse independently and accurately       Understanding of Exercise Prescription Yes       Intervention Provide education, explanation, and written materials on patient's individual exercise prescription       Expected Outcomes Short Term: Able to explain program exercise prescription;Long Term: Able to explain home exercise prescription to exercise independently                Exercise Goals Re-Evaluation :  Exercise Goals Re-Evaluation  Row Name 08/25/23 1036             Exercise Goal Re-Evaluation   Exercise Goals Review Able to understand and use rate of perceived exertion (RPE) scale;Able to understand and use Dyspnea scale;Knowledge and understanding of Target Heart Rate Range (THRR);Understanding of Exercise Prescription       Comments Reviewed RPE and dyspnea scale, THR and program prescription with pt today.  Pt voiced understanding and was given a copy of goals to take home.       Expected Outcomes Short: Use RPE daily to regulate intensity. Long: Follow program prescription in THR.                Discharge Exercise Prescription (Final Exercise Prescription Changes):  Exercise Prescription Changes - 08/16/23 1500       Response to Exercise   Blood Pressure (Admit) 138/70    Blood Pressure (Exercise) 142/82    Blood Pressure (Exit) 132/80    Heart Rate (Admit) 71 bpm    Heart Rate (Exercise) 99 bpm    Heart Rate (Exit) 81 bpm    Oxygen Saturation (Admit) 95 %    Oxygen Saturation (Exercise) 93 %    Oxygen Saturation (Exit) 95 %    Rating of Perceived Exertion (Exercise) 15    Perceived Dyspnea (Exercise) 1    Symptoms none    Comments 6 MWT results             Nutrition:  Target Goals: Understanding of nutrition guidelines, daily intake of sodium 1500mg , cholesterol 200mg ,  calories 30% from fat and 7% or less from saturated fats, daily to have 5 or more servings of fruits and vegetables.  Education: All About Nutrition: -Group instruction provided by verbal, written material, interactive activities, discussions, models, and posters to present general guidelines for heart healthy nutrition including fat, fiber, MyPlate, the role of sodium in heart healthy nutrition, utilization of the nutrition label, and utilization of this knowledge for meal planning. Follow up email sent as well. Written material given at graduation.   Biometrics:  Pre Biometrics - 08/16/23 1542       Pre Biometrics   Height 5' (1.524 m)    Weight 154 lb (69.9 kg)    Waist Circumference 40 inches    Hip Circumference 44.5 inches    Waist to Hip Ratio 0.9 %    BMI (Calculated) 30.08    Single Leg Stand 4.66 seconds              Nutrition Therapy Plan and Nutrition Goals:   Nutrition Assessments:  MEDIFICTS Score Key: >=70 Need to make dietary changes  40-70 Heart Healthy Diet <= 40 Therapeutic Level Cholesterol Diet  Flowsheet Row Cardiac Rehab from 08/16/2023 in [facility] Cardiac and Pulmonary Rehab  Picture Your Plate Total Score on Admission 75      Picture Your Plate Scores: <40 Unhealthy dietary pattern with much room for improvement. 41-50 Dietary pattern unlikely to meet recommendations for good health and room for improvement. 51-60 More healthful dietary pattern, with some room for improvement.  >60 Healthy dietary pattern, although there may be some specific behaviors that could be improved.    Nutrition Goals Re-Evaluation:   Nutrition Goals Discharge (Final Nutrition Goals Re-Evaluation):   Psychosocial: Target Goals: Acknowledge presence or absence of significant depression and/or stress, maximize coping skills, provide positive support system. Participant is able to verbalize types and ability to use techniques and skills needed for reducing stress and  depression.  Education: Stress, Anxiety, and Depression - Group verbal and visual presentation to define topics covered.  Reviews how body is impacted by stress, anxiety, and depression.  Also discusses healthy ways to reduce stress and to treat/manage anxiety and depression.  Written material given at graduation.   Education: Sleep Hygiene -Provides group verbal and written instruction about how sleep can affect your health.  Define sleep hygiene, discuss sleep cycles and impact of sleep habits. Review good sleep hygiene tips.    Initial Review & Psychosocial Screening:  Initial Psych Review & Screening - 08/12/23 1017       Initial Review   Current issues with None Identified      Family Dynamics   Good Support System? Yes    Comments Ellec has a good family support system and can look to her five children for support. She does not take any medications for her mood.      Barriers   Psychosocial barriers to participate in program The patient should benefit from training in stress management and relaxation.;There are no identifiable barriers or psychosocial needs.      Screening Interventions   Interventions To provide support and resources with identified psychosocial needs;Encouraged to exercise;Provide feedback about the scores to participant    Expected Outcomes Short Term goal: Utilizing psychosocial counselor, staff and physician to assist with identification of specific Stressors or current issues interfering with healing process. Setting desired goal for each stressor or current issue identified.;Long Term Goal: Stressors or current issues are controlled or eliminated.;Short Term goal: Identification and review with participant of any Quality of Life or Depression concerns found by scoring the questionnaire.;Long Term goal: The participant improves quality of Life and PHQ9 Scores as seen by post scores and/or verbalization of changes             Quality of Life Scores:    Quality of Life - 08/16/23 1545       Quality of Life   Select Quality of Life      Quality of Life Scores   Health/Function Pre 23.9 %    Socioeconomic Pre 22.5 %    Psych/Spiritual Pre 24.29 %    Family Pre 25.8 %    GLOBAL Pre 23.93 %            Scores of 19 and below usually indicate a poorer quality of life in these areas.  A difference of  2-3 points is a clinically meaningful difference.  A difference of 2-3 points in the total score of the Quality of Life Index has been associated with significant improvement in overall quality of life, self-image, physical symptoms, and general health in studies assessing change in quality of life.  PHQ-9: Review Flowsheet       08/16/2023  Depression screen PHQ 2/9  Decreased Interest 0  Down, Depressed, Hopeless 0  PHQ - 2 Score 0  Altered sleeping 1  Tired, decreased energy 1  Change in appetite 0  Feeling bad or failure about yourself  0  Trouble concentrating 0  Moving slowly or fidgety/restless 0  Suicidal thoughts 0  PHQ-9 Score 2  Difficult doing work/chores Not difficult at all    Details           Interpretation of Total Score  Total Score Depression Severity:  1-4 = Minimal depression, 5-9 = Mild depression, 10-14 = Moderate depression, 15-19 = Moderately severe depression, 20-27 = Severe depression   Psychosocial Evaluation and Intervention:  Psychosocial Evaluation - 08/12/23  1018       Psychosocial Evaluation & Interventions   Interventions Relaxation education;Stress management education;Encouraged to exercise with the program and follow exercise prescription    Comments Ellec has a good family support system and can look to her five children for support. She does not take any medications for her mood.    Expected Outcomes Short: Start HeartTrack to help with mood. Long: Maintain a healthy mental state    Continue Psychosocial Services  Follow up required by staff             Psychosocial  Re-Evaluation:   Psychosocial Discharge (Final Psychosocial Re-Evaluation):   Vocational Rehabilitation: Provide vocational rehab assistance to qualifying candidates.   Vocational Rehab Evaluation & Intervention:   Education: Education Goals: Education classes will be provided on a variety of topics geared toward better understanding of heart health and risk factor modification. Participant will state understanding/return demonstration of topics presented as noted by education test scores.  Learning Barriers/Preferences:  Learning Barriers/Preferences - 08/12/23 1016       Learning Barriers/Preferences   Learning Barriers None    Learning Preferences None             General Cardiac Education Topics:  AED/CPR: - Group verbal and written instruction with the use of models to demonstrate the basic use of the AED with the basic ABC's of resuscitation.   Anatomy and Cardiac Procedures: - Group verbal and visual presentation and models provide information about basic cardiac anatomy and function. Reviews the testing methods done to diagnose heart disease and the outcomes of the test results. Describes the treatment choices: Medical Management, Angioplasty, or Coronary Bypass Surgery for treating various heart conditions including Myocardial Infarction, Angina, Valve Disease, and Cardiac Arrhythmias.  Written material given at graduation. Flowsheet Row Cardiac Rehab from 08/25/2023 in [facility] Cardiac and Pulmonary Rehab  Education need identified 08/16/23  Date 08/25/23  Educator SB  Instruction Review Code 1- Verbalizes Understanding       Medication Safety: - Group verbal and visual instruction to review commonly prescribed medications for heart and lung disease. Reviews the medication, class of the drug, and side effects. Includes the steps to properly store meds and maintain the prescription regimen.  Written material given at graduation.   Intimacy: - Group verbal  instruction through game format to discuss how heart and lung disease can affect sexual intimacy. Written material given at graduation..   Know Your Numbers and Heart Failure: - Group verbal and visual instruction to discuss disease risk factors for cardiac and pulmonary disease and treatment options.  Reviews associated critical values for Overweight/Obesity, Hypertension, Cholesterol, and Diabetes.  Discusses basics of heart failure: signs/symptoms and treatments.  Introduces Heart Failure Zone chart for action plan for heart failure.  Written material given at graduation.   Infection Prevention: - Provides verbal and written material to individual with discussion of infection control including proper hand washing and proper equipment cleaning during exercise session. Flowsheet Row Cardiac Rehab from 08/25/2023 in [facility] Cardiac and Pulmonary Rehab  Date 08/16/23  Educator [facility]  Instruction Review Code 1- Verbalizes Understanding       Falls Prevention: - Provides verbal and written material to individual with discussion of falls prevention and safety. Flowsheet Row Cardiac Rehab from 08/25/2023 in [facility] Cardiac and Pulmonary Rehab  Date 08/16/23  Educator [facility]  Instruction Review Code 1- Verbalizes Understanding       Other: -Provides group and verbal instruction on various topics (see comments)  Knowledge Questionnaire Score:  Knowledge Questionnaire Score - 08/16/23 1544       Knowledge Questionnaire Score   Pre Score 24/26             Core Components/Risk Factors/Patient Goals at Admission:  Personal Goals and Risk Factors at Admission - 08/16/23 1546       Core Components/Risk Factors/Patient Goals on Admission    Weight Management Yes;Weight Loss    Intervention Weight Management: Develop a combined nutrition and exercise program designed to reach desired caloric intake, while maintaining appropriate intake of nutrient and fiber, sodium and fats, and appropriate  energy expenditure required for the weight goal.;Weight Management: Provide education and appropriate resources to help participant work on and attain dietary goals.;Weight Management/Obesity: Establish reasonable short term and long term weight goals.    Admit Weight 154 lb (69.9 kg)    Goal Weight: Short Term 148 lb (67.1 kg)    Goal Weight: Long Term 140 lb (63.5 kg)    Expected Outcomes Short Term: Continue to assess and modify interventions until short term weight is achieved;Long Term: Adherence to nutrition and physical activity/exercise program aimed toward attainment of established weight goal;Weight Loss: Understanding of general recommendations for a balanced deficit meal plan, which promotes 1-2 lb weight loss per week and includes a negative energy balance of [phone] kcal/d;Understanding recommendations for meals to include 15-35% energy as protein, 25-35% energy from fat, 35-60% energy from carbohydrates, less than 200mg  of dietary cholesterol, 20-35 gm of total fiber daily;Understanding of distribution of calorie intake throughout the day with the consumption of 4-5 meals/snacks    Diabetes Yes    Intervention Provide education about signs/symptoms and action to take for hypo/hyperglycemia.;Provide education about proper nutrition, including hydration, and aerobic/resistive exercise prescription along with prescribed medications to achieve blood glucose in normal ranges: Fasting glucose 65-99 mg/dL    Expected Outcomes Short Term: Participant verbalizes understanding of the signs/symptoms and immediate care of hyper/hypoglycemia, proper foot care and importance of medication, aerobic/resistive exercise and nutrition plan for blood glucose control.;Long Term: Attainment of HbA1C < 7%.    Heart Failure Yes    Intervention Provide a combined exercise and nutrition program that is supplemented with education, support and counseling about heart failure. Directed toward relieving symptoms such as  shortness of breath, decreased exercise tolerance, and extremity edema.    Expected Outcomes Improve functional capacity of life;Short term: Attendance in program 2-3 days a week with increased exercise capacity. Reported lower sodium intake. Reported increased fruit and vegetable intake. Reports medication compliance.;Short term: Daily weights obtained and reported for increase. Utilizing diuretic protocols set by physician.;Long term: Adoption of self-care skills and reduction of barriers for early signs and symptoms recognition and intervention leading to self-care maintenance.    Hypertension Yes    Intervention Provide education on lifestyle modifcations including regular physical activity/exercise, weight management, moderate sodium restriction and increased consumption of fresh fruit, vegetables, and low fat dairy, alcohol moderation, and smoking cessation.;Monitor prescription use compliance.    Expected Outcomes Short Term: Continued assessment and intervention until BP is < 140/55mm HG in hypertensive participants. < 130/43mm HG in hypertensive participants with diabetes, heart failure or chronic kidney disease.;Long Term: Maintenance of blood pressure at goal levels.    Lipids Yes    Intervention Provide education and support for participant on nutrition & aerobic/resistive exercise along with prescribed medications to achieve LDL 70mg , HDL >40mg .    Expected Outcomes Short Term: Participant states understanding of desired cholesterol values and  is compliant with medications prescribed. Participant is following exercise prescription and nutrition guidelines.;Long Term: Cholesterol controlled with medications as prescribed, with individualized exercise RX and with personalized nutrition plan. Value goals: LDL < 70mg , HDL > 40 mg.             Education:Diabetes - Individual verbal and written instruction to review signs/symptoms of diabetes, desired ranges of glucose level fasting, after  meals and with exercise. Acknowledge that pre and post exercise glucose checks will be done for 3 sessions at entry of program. Flowsheet Row Cardiac Rehab from 08/25/2023 in [facility] Cardiac and Pulmonary Rehab  Date 08/16/23  Educator [facility]  Instruction Review Code 1- Verbalizes Understanding       Core Components/Risk Factors/Patient Goals Review:    Core Components/Risk Factors/Patient Goals at Discharge (Final Review):    ITP Comments:  ITP Comments     Row Name 08/12/23 1015 08/16/23 1525 08/25/23 1035 08/25/23 1124     ITP Comments Virtual Visit completed. Patient informed on EP and RD appointment and 6 Minute walk test. Patient also informed of patient health questionnaires on My Chart. Patient Verbalizes understanding. Visit diagnosis can be found in [facility] 07/06/[facility]. Completed and gym orientation. Initial ITP created and sent for review to Dr. Bethann Punches, Medical Director. First full day of exercise!  Patient was oriented to gym and equipment including functions, settings, policies, and procedures.  Patient's individual exercise prescription and treatment plan were reviewed.  All starting workloads were established based on the results of the 6 minute walk test done at initial orientation visit.  The plan for exercise progression was also introduced and progression will be customized based on patient's performance and goals. 30 Day review completed. Medical Director ITP review done, changes made as directed, and signed approval by Medical Director.    new to program             Comments:

## 2023-08-30 ENCOUNTER — Encounter: Payer: PPO | Admitting: *Deleted

## 2023-08-30 DIAGNOSIS — I25110 Atherosclerotic heart disease of native coronary artery with unstable angina pectoris: Secondary | ICD-10-CM | POA: Diagnosis not present

## 2023-08-30 DIAGNOSIS — Z951 Presence of aortocoronary bypass graft: Secondary | ICD-10-CM

## 2023-08-30 LAB — GLUCOSE, CAPILLARY
Glucose-Capillary: 130 mg/dL — ABNORMAL HIGH (ref 70–99)
Glucose-Capillary: 105 mg/dL — ABNORMAL HIGH (ref 70–99)

## 2023-08-30 NOTE — Progress Notes (Signed)
Daily Session Note  Patient Details  Name: Rebekah Gibson MRN: [id number] Date of Birth: [birth year] Referring Provider:   Flowsheet Row Cardiac Rehab from 08/16/2023 in [facility] Cardiac and Pulmonary Rehab  Referring Provider Dr. Trixie Rude       Encounter Date: 08/30/2023  Check In:  Session Check In - 08/30/23 1119       Check-In   Supervising physician immediately available to respond to emergencies See telemetry face sheet for immediately available ER MD    Location [facility]-Cardiac & Pulmonary Rehab    Staff Present Cora Collum, RN, BSN, CCRP;Margaret Best, MS, Exercise Physiologist;Meredith Jewel Baize, RN Atilano Median, RN, ADN    Virtual Visit No    Medication changes reported     No    Fall or balance concerns reported    No    Warm-up and Cool-down Performed on first and last piece of equipment    Resistance Training Performed Yes    VAD Patient? No    PAD/SET Patient? No      Pain Assessment   Currently in Pain? No/denies                Social History   Tobacco Use  Smoking Status Never  Smokeless Tobacco Never    Goals Met:  Independence with exercise equipment Exercise tolerated well No report of concerns or symptoms today Strength training completed today  Goals Unmet:  Not Applicable  Comments: Pt able to follow exercise prescription today without complaint.  Will continue to monitor for progression.    Dr. Bethann Punches is Medical Director for [facility] Cardiac Rehabilitation.  Dr. Vida Rigger is Medical Director for [facility] Pulmonary Rehabilitation.

## 2023-09-01 ENCOUNTER — Encounter: Payer: PPO | Admitting: *Deleted

## 2023-09-01 DIAGNOSIS — Z951 Presence of aortocoronary bypass graft: Secondary | ICD-10-CM

## 2023-09-01 DIAGNOSIS — I25110 Atherosclerotic heart disease of native coronary artery with unstable angina pectoris: Secondary | ICD-10-CM | POA: Diagnosis not present

## 2023-09-01 NOTE — Progress Notes (Signed)
Daily Session Note  Patient Details  Name: Rebekah Gibson MRN: [id number] Date of Birth: 12-21-1945 Referring Provider:   Flowsheet Row Cardiac Rehab from 08/16/2023 in [facility] Cardiac and Pulmonary Rehab  Referring Provider Dr. Trixie Rude       Encounter Date: 09/01/2023  Check In:  Session Check In - 09/01/23 1044       Check-In   Supervising physician immediately available to respond to emergencies See telemetry face sheet for immediately available ER MD    Location [facility]-Cardiac & Pulmonary Rehab    Staff Present Rory Percy, MS, Exercise Physiologist;Meredith Jewel Baize, RN BSN;Maxon Conetta BS, , Exercise Physiologist;Esther Broyles Katrinka Blazing, RN, ADN    Virtual Visit No    Medication changes reported     No    Fall or balance concerns reported    No    Warm-up and Cool-down Performed on first and last piece of equipment    Resistance Training Performed Yes    VAD Patient? No    PAD/SET Patient? No      Pain Assessment   Currently in Pain? No/denies                Social History   Tobacco Use  Smoking Status Never  Smokeless Tobacco Never    Goals Met:  Independence with exercise equipment Exercise tolerated well No report of concerns or symptoms today Strength training completed today  Goals Unmet:  Not Applicable  Comments: Pt able to follow exercise prescription today without complaint.  Will continue to monitor for progression.    Dr. Bethann Punches is Medical Director for [facility] Cardiac Rehabilitation.  Dr. Vida Rigger is Medical Director for [facility] Pulmonary Rehabilitation.

## 2023-09-06 ENCOUNTER — Encounter: Payer: PPO | Attending: Internal Medicine | Admitting: *Deleted

## 2023-09-06 DIAGNOSIS — Z5189 Encounter for other specified aftercare: Secondary | ICD-10-CM | POA: Insufficient documentation

## 2023-09-06 DIAGNOSIS — I252 Old myocardial infarction: Secondary | ICD-10-CM | POA: Diagnosis not present

## 2023-09-06 DIAGNOSIS — I25110 Atherosclerotic heart disease of native coronary artery with unstable angina pectoris: Secondary | ICD-10-CM | POA: Diagnosis not present

## 2023-09-06 DIAGNOSIS — Z951 Presence of aortocoronary bypass graft: Secondary | ICD-10-CM | POA: Diagnosis not present

## 2023-09-06 NOTE — Progress Notes (Signed)
Daily Session Note  Patient Details  Name: Rebekah Gibson MRN: [id number] Date of Birth: [birth year] Referring Provider:   Flowsheet Row Cardiac Rehab from 08/16/2023 in [facility] Cardiac and Pulmonary Rehab  Referring Provider Dr. Trixie Rude       Encounter Date: 09/06/2023  Check In:  Session Check In - 09/06/23 1055       Check-In   Supervising physician immediately available to respond to emergencies See telemetry face sheet for immediately available ER MD    Location [facility]-Cardiac & Pulmonary Rehab    Staff Present Rory Percy, MS, Exercise Physiologist;Laureen Manson Passey, BS, RRT, CPFT;Kelly Madilyn Fireman, BS, ACSM CEP, Exercise Physiologist;Dinia Joynt Katrinka Blazing, RN, ADN    Virtual Visit No    Medication changes reported     No    Fall or balance concerns reported    No    Warm-up and Cool-down Performed on first and last piece of equipment    Resistance Training Performed Yes    VAD Patient? No    PAD/SET Patient? No      Pain Assessment   Currently in Pain? No/denies                Social History   Tobacco Use  Smoking Status Never  Smokeless Tobacco Never    Goals Met:  Independence with exercise equipment Exercise tolerated well No report of concerns or symptoms today Strength training completed today  Goals Unmet:  Not Applicable  Comments: Pt able to follow exercise prescription today without complaint.  Will continue to monitor for progression.    Dr. Bethann Punches is Medical Director for [facility] Cardiac Rehabilitation.  Dr. Vida Rigger is Medical Director for [facility] Pulmonary Rehabilitation.

## 2023-09-08 ENCOUNTER — Encounter: Payer: PPO | Admitting: *Deleted

## 2023-09-08 DIAGNOSIS — Z951 Presence of aortocoronary bypass graft: Secondary | ICD-10-CM

## 2023-09-08 DIAGNOSIS — Z5189 Encounter for other specified aftercare: Secondary | ICD-10-CM | POA: Diagnosis not present

## 2023-09-08 NOTE — Progress Notes (Signed)
Daily Session Note  Patient Details  Name: Rebekah Gibson MRN: [id number] Date of Birth: [birth year] Referring Provider:   Flowsheet Row Cardiac Rehab from 08/16/2023 in [facility] Cardiac and Pulmonary Rehab  Referring Provider Dr. Trixie Rude       Encounter Date: 09/08/2023  Check In:  Session Check In - 09/08/23 1046       Check-In   Supervising physician immediately available to respond to emergencies See telemetry face sheet for immediately available ER MD    Location [facility]-Cardiac & Pulmonary Rehab    Staff Present Rory Percy, MS, Exercise Physiologist;Meredith Jewel Baize, RN BSN;Joseph Shelbie Proctor, RN, ADN    Virtual Visit No    Medication changes reported     No    Fall or balance concerns reported    No    Warm-up and Cool-down Performed on first and last piece of equipment    Resistance Training Performed Yes    VAD Patient? No    PAD/SET Patient? No      Pain Assessment   Currently in Pain? No/denies                Social History   Tobacco Use  Smoking Status Never  Smokeless Tobacco Never    Goals Met:  Independence with exercise equipment Exercise tolerated well No report of concerns or symptoms today Strength training completed today  Goals Unmet:  Not Applicable  Comments: Pt able to follow exercise prescription today without complaint.  Will continue to monitor for progression.    Dr. Bethann Punches is Medical Director for [facility] Cardiac Rehabilitation.  Dr. Vida Rigger is Medical Director for [facility] Pulmonary Rehabilitation.

## 2023-09-10 ENCOUNTER — Encounter: Payer: PPO | Admitting: *Deleted

## 2023-09-10 DIAGNOSIS — Z5189 Encounter for other specified aftercare: Secondary | ICD-10-CM | POA: Diagnosis not present

## 2023-09-10 DIAGNOSIS — Z951 Presence of aortocoronary bypass graft: Secondary | ICD-10-CM

## 2023-09-10 NOTE — Progress Notes (Signed)
Daily Session Note  Patient Details  Name: Rebekah Gibson MRN: [id number] Date of Birth: Jul 11, 1946 Referring Provider:   Flowsheet Row Cardiac Rehab from 08/16/2023 in [facility] Cardiac and Pulmonary Rehab  Referring Provider Dr. Trixie Rude       Encounter Date: 09/10/2023  Check In:  Session Check In - 09/10/23 1119       Check-In   Supervising physician immediately available to respond to emergencies See telemetry face sheet for immediately available ER MD    Location [facility]-Cardiac & Pulmonary Rehab    Staff Present Cora Collum, RN, BSN, CCRP;Joseph Hood, RCP,RRT,BSRT;Noah Tickle, [name], Exercise Physiologist    Virtual Visit No    Medication changes reported     No    Fall or balance concerns reported    No    Warm-up and Cool-down Performed on first and last piece of equipment    Resistance Training Performed Yes    VAD Patient? No    PAD/SET Patient? No      Pain Assessment   Currently in Pain? No/denies                Social History   Tobacco Use  Smoking Status Never  Smokeless Tobacco Never    Goals Met:  Independence with exercise equipment Exercise tolerated well No report of concerns or symptoms today  Goals Unmet:  Not Applicable  Comments: Pt able to follow exercise prescription today without complaint.  Will continue to monitor for progression.    Dr. Bethann Punches is Medical Director for [facility] Cardiac Rehabilitation.  Dr. Vida Rigger is Medical Director for [facility] Pulmonary Rehabilitation.

## 2023-09-13 ENCOUNTER — Encounter: Payer: PPO | Admitting: *Deleted

## 2023-09-13 DIAGNOSIS — Z951 Presence of aortocoronary bypass graft: Secondary | ICD-10-CM

## 2023-09-13 DIAGNOSIS — Z5189 Encounter for other specified aftercare: Secondary | ICD-10-CM | POA: Diagnosis not present

## 2023-09-13 NOTE — Progress Notes (Signed)
Daily Session Note  Patient Details  Name: JUANNA FEILEN MRN: [id number] Date of Birth: 02-25-1946 Referring Provider:   Flowsheet Row Cardiac Rehab from 08/16/2023 in [facility] Cardiac and Pulmonary Rehab  Referring Provider Dr. Trixie Rude       Encounter Date: 09/13/2023  Check In:  Session Check In - 09/13/23 1119       Check-In   Supervising physician immediately available to respond to emergencies See telemetry face sheet for immediately available ER MD    Location [facility]-Cardiac & Pulmonary Rehab    Staff Present Cora Collum, RN, BSN, CCRP;Margaret Best, MS, Exercise Physiologist;Maxon Conetta BS, , Exercise Physiologist;Jantzen Pilger Katrinka Blazing, RN, ADN    Virtual Visit No    Medication changes reported     No    Fall or balance concerns reported    No    Warm-up and Cool-down Performed on first and last piece of equipment    Resistance Training Performed Yes    VAD Patient? No    PAD/SET Patient? No      Pain Assessment   Currently in Pain? No/denies                Social History   Tobacco Use  Smoking Status Never  Smokeless Tobacco Never    Goals Met:  Independence with exercise equipment Exercise tolerated well No report of concerns or symptoms today Strength training completed today  Goals Unmet:  Not Applicable  Comments: Pt able to follow exercise prescription today without complaint.  Will continue to monitor for progression.    Dr. Bethann Punches is Medical Director for [facility] Cardiac Rehabilitation.  Dr. Vida Rigger is Medical Director for [facility] Pulmonary Rehabilitation.

## 2023-09-15 ENCOUNTER — Encounter: Payer: PPO | Admitting: *Deleted

## 2023-09-15 DIAGNOSIS — Z5189 Encounter for other specified aftercare: Secondary | ICD-10-CM | POA: Diagnosis not present

## 2023-09-15 DIAGNOSIS — Z951 Presence of aortocoronary bypass graft: Secondary | ICD-10-CM

## 2023-09-15 NOTE — Progress Notes (Signed)
Daily Session Note  Patient Details  Name: Rebekah Gibson MRN: [id number] Date of Birth: 1946-02-19 Referring Provider:   Flowsheet Row Cardiac Rehab from 08/16/2023 in [facility] Cardiac and Pulmonary Rehab  Referring Provider Dr. Trixie Rude       Encounter Date: 09/15/2023  Check In:  Session Check In - 09/15/23 1101       Check-In   Supervising physician immediately available to respond to emergencies See telemetry face sheet for immediately available ER MD    Location [facility]-Cardiac & Pulmonary Rehab    Staff Present Cora Collum, RN, BSN, CCRP;Noah Tickle, BS, Exercise Physiologist;Maxon Conetta BS, , Exercise Physiologist;Nazifa Trinka Katrinka Blazing, RN, ADN    Virtual Visit No    Medication changes reported     No    Fall or balance concerns reported    No    Warm-up and Cool-down Performed on first and last piece of equipment    Resistance Training Performed Yes    VAD Patient? No    PAD/SET Patient? No      Pain Assessment   Currently in Pain? No/denies                Social History   Tobacco Use  Smoking Status Never  Smokeless Tobacco Never    Goals Met:  Independence with exercise equipment Exercise tolerated well No report of concerns or symptoms today Strength training completed today  Goals Unmet:  Not Applicable  Comments: Pt able to follow exercise prescription today without complaint.  Will continue to monitor for progression.    Dr. Bethann Punches is Medical Director for [facility] Cardiac Rehabilitation.  Dr. Vida Rigger is Medical Director for [facility] Pulmonary Rehabilitation.

## 2023-09-17 ENCOUNTER — Encounter: Payer: PPO | Admitting: *Deleted

## 2023-09-17 DIAGNOSIS — Z5189 Encounter for other specified aftercare: Secondary | ICD-10-CM | POA: Diagnosis not present

## 2023-09-17 DIAGNOSIS — Z951 Presence of aortocoronary bypass graft: Secondary | ICD-10-CM

## 2023-09-17 NOTE — Progress Notes (Signed)
Daily Session Note  Patient Details  Name: Rebekah Gibson MRN: [id number] Date of Birth: 01/20/1946 Referring Provider:   Flowsheet Row Cardiac Rehab from 08/16/2023 in [facility] Cardiac and Pulmonary Rehab  Referring Provider Dr. Trixie Rude       Encounter Date: 09/17/2023  Check In:  Session Check In - 09/17/23 1116       Check-In   Supervising physician immediately available to respond to emergencies See telemetry face sheet for immediately available ER MD    Location [facility]-Cardiac & Pulmonary Rehab    Staff Present Ronette Deter, BS, Exercise Physiologist;Joseph Shelbie Proctor, RN, [place]    Virtual Visit No    Medication changes reported     No    Fall or balance concerns reported    No    Warm-up and Cool-down Performed on first and last piece of equipment    Resistance Training Performed Yes    VAD Patient? No    PAD/SET Patient? No      Pain Assessment   Currently in Pain? No/denies                Social History   Tobacco Use  Smoking Status Never  Smokeless Tobacco Never    Goals Met:  Independence with exercise equipment Exercise tolerated well No report of concerns or symptoms today Strength training completed today  Goals Unmet:  Not Applicable  Comments: Pt able to follow exercise prescription today without complaint.  Will continue to monitor for progression.    Dr. Bethann Punches is Medical Director for [facility] Cardiac Rehabilitation.  Dr. Vida Rigger is Medical Director for [facility] Pulmonary Rehabilitation.

## 2023-09-20 ENCOUNTER — Encounter: Payer: PPO | Admitting: *Deleted

## 2023-09-20 DIAGNOSIS — Z951 Presence of aortocoronary bypass graft: Secondary | ICD-10-CM

## 2023-09-20 DIAGNOSIS — Z5189 Encounter for other specified aftercare: Secondary | ICD-10-CM | POA: Diagnosis not present

## 2023-09-20 NOTE — Progress Notes (Signed)
Daily Session Note  Patient Details  Name: Rebekah Gibson MRN: [id number] Date of Birth: Jun 13, 1946 Referring Provider:   Flowsheet Row Cardiac Rehab from 08/16/2023 in [facility] Cardiac and Pulmonary Rehab  Referring Provider Dr. Trixie Rude       Encounter Date: 09/20/2023  Check In:  Session Check In - 09/20/23 1129       Check-In   Supervising physician immediately available to respond to emergencies See telemetry face sheet for immediately available ER MD    Location [facility]-Cardiac & Pulmonary Rehab    Staff Present Rory Percy, MS, Exercise Physiologist;Maxon Suzzette Righter, , Exercise Physiologist;Kelly Madilyn Fireman, BS, ACSM CEP, Exercise Physiologist;Jaydalynn Olivero Katrinka Blazing, RN, ADN    Virtual Visit No    Medication changes reported     Yes    Comments d/c plavix    Fall or balance concerns reported    No    Warm-up and Cool-down Performed on first and last piece of equipment    Resistance Training Performed Yes    VAD Patient? No    PAD/SET Patient? No      Pain Assessment   Currently in Pain? No/denies                Social History   Tobacco Use  Smoking Status Never  Smokeless Tobacco Never    Goals Met:  Independence with exercise equipment Exercise tolerated well No report of concerns or symptoms today Strength training completed today  Goals Unmet:  Not Applicable  Comments: Pt able to follow exercise prescription today without complaint.  Will continue to monitor for progression.    Dr. Bethann Punches is Medical Director for [facility] Cardiac Rehabilitation.  Dr. Vida Rigger is Medical Director for [facility] Pulmonary Rehabilitation.

## 2023-09-22 ENCOUNTER — Encounter: Payer: PPO | Admitting: *Deleted

## 2023-09-22 ENCOUNTER — Encounter: Payer: Self-pay | Admitting: *Deleted

## 2023-09-22 DIAGNOSIS — Z951 Presence of aortocoronary bypass graft: Secondary | ICD-10-CM

## 2023-09-22 DIAGNOSIS — Z5189 Encounter for other specified aftercare: Secondary | ICD-10-CM | POA: Diagnosis not present

## 2023-09-22 NOTE — Progress Notes (Signed)
Daily Session Note  Patient Details  Name: ADAMA COREA MRN: [id number] Date of Birth: 07/08/1946 Referring Provider:   Flowsheet Row Cardiac Rehab from 08/16/2023 in [facility] Cardiac and Pulmonary Rehab  Referring Provider Dr. Trixie Rude       Encounter Date: 09/22/2023  Check In:  Session Check In - 09/22/23 1118       Check-In   Supervising physician immediately available to respond to emergencies See telemetry face sheet for immediately available ER MD    Location [facility]-Cardiac & Pulmonary Rehab    Staff Present Elige Ko, RCP,RRT,BSRT;Krista Karleen Hampshire RN, BSN;Maxon Conetta BS, , Exercise Physiologist;Finnean Cerami Katrinka Blazing, RN, ADN    Virtual Visit No    Medication changes reported     No    Fall or balance concerns reported    No    Warm-up and Cool-down Performed on first and last piece of equipment    Resistance Training Performed Yes    VAD Patient? No    PAD/SET Patient? No      Pain Assessment   Currently in Pain? No/denies                Social History   Tobacco Use  Smoking Status Never  Smokeless Tobacco Never    Goals Met:  Independence with exercise equipment Exercise tolerated well No report of concerns or symptoms today Strength training completed today  Goals Unmet:  Not Applicable  Comments: Pt able to follow exercise prescription today without complaint.  Will continue to monitor for progression.    Dr. Bethann Punches is Medical Director for [facility] Cardiac Rehabilitation.  Dr. Vida Rigger is Medical Director for [facility] Pulmonary Rehabilitation.

## 2023-09-22 NOTE — Progress Notes (Signed)
Cardiac Individual Treatment Plan  Patient Details  Name: Rebekah Gibson MRN: [id number] Date of Birth: 06-30-1946 Referring Provider:   Flowsheet Row Cardiac Rehab from 08/16/2023 in [facility] Cardiac and Pulmonary Rehab  Referring Provider Dr. Trixie Rude       Initial Encounter Date:  Flowsheet Row Cardiac Rehab from 08/16/2023 in [facility] Cardiac and Pulmonary Rehab  Date 08/16/23       Visit Diagnosis: S/P CABG x 2  Patient's Home Medications on Admission:  Current Outpatient Medications:    aspirin 81 MG chewable tablet, Chew by mouth., Disp: , Rfl:    atorvastatin (LIPITOR) 20 MG tablet, Take 1 tablet (20 mg total) by mouth at bedtime., Disp: , Rfl:    Cayenne 450 MG CAPS, Take 1 capsule by mouth as directed. (Patient not taking: Reported on 08/12/2023), Disp: , Rfl:    chlorhexidine (PERIDEX) 0.12 % solution, SMARTSIG:5-10 Milliliter(s) By Mouth Morning-Night (Patient not taking: Reported on 08/12/2023), Disp: , Rfl:    cholecalciferol (VITAMIN D3) 25 MCG (1000 UNIT) tablet, Take 1,000 Units by mouth daily., Disp: , Rfl:    Cinnamon 500 MG capsule, Take by mouth. (Patient not taking: Reported on 08/12/2023), Disp: , Rfl:    Cinnamon 500 MG TABS, Take 1 tablet by mouth as directed., Disp: , Rfl:    Cranberry 400 MG CAPS, Take 1 capsule by mouth as directed. (Patient not taking: Reported on 08/12/2023), Disp: , Rfl:    Cranberry 425 MG CAPS, Take by mouth., Disp: , Rfl:    diphenhydrAMINE (BENADRYL) 25 MG tablet, Take 25 mg by mouth every 6 (six) hours as needed (dizziness)., Disp: , Rfl:    empagliflozin (JARDIANCE) 10 MG TABS tablet, Take 10 mg by mouth daily., Disp: , Rfl:    ENTRESTO 49-51 MG, Take 1 tablet by mouth 2 (two) times daily., Disp: , Rfl:    Ginkgo Biloba 500 MG CAPS, Take 2,000 mg by mouth as directed. (Patient not taking: Reported on 08/12/2023), Disp: , Rfl:    heparin 40102 UT/250ML infusion, Inject 1,100 Units/hr into the vein continuous. (Patient not taking:  Reported on 08/12/2023), Disp: , Rfl:    insulin aspart (NOVOLOG) 100 UNIT/ML injection, Inject 0-15 Units into the skin 3 (three) times daily with meals. (Patient not taking: Reported on 08/12/2023), Disp: , Rfl:    Magnesium 500 MG TABS, Take 1 tablet by mouth as directed., Disp: , Rfl:    metoprolol succinate (TOPROL-XL) 50 MG 24 hr tablet, Take 50 mg by mouth daily., Disp: , Rfl:    Morphine Sulfate (MORPHINE, PF,) 2 MG/ML injection, Inject 1 mL (2 mg total) into the vein every 3 (three) hours as needed. (Patient not taking: Reported on 08/12/2023), Disp: 1 mL, Rfl: 0   olmesartan-hydrochlorothiazide (BENICAR HCT) 40-12.5 MG tablet, Take 1 tablet by mouth daily. (Patient not taking: Reported on 08/12/2023), Disp: , Rfl:   Past Medical History: Past Medical History:  Diagnosis Date   EKG, abnormal    Female bladder prolapse    Frequency of urination    History of UTI    History of vertigo    Hypertension    Nocturia    Pre-diabetes    DIET CONTROLLED    Tobacco Use: Social History   Tobacco Use  Smoking Status Never  Smokeless Tobacco Never    Labs: Review Flowsheet        No data to display           Exercise Target Goals: Exercise Program Goal:  Individual exercise prescription set using results from initial 6 min walk test and THRR while considering  patient's activity barriers and safety.   Exercise Prescription Goal: Initial exercise prescription builds to 30-45 minutes a day of aerobic activity, 2-3 days per week.  Home exercise guidelines will be given to patient during program as part of exercise prescription that the participant will acknowledge.   Education: Aerobic Exercise: - Group verbal and visual presentation on the components of exercise prescription. Introduces F.I.T.T principle from ACSM for exercise prescriptions.  Reviews F.I.T.T. principles of aerobic exercise including progression. Written material given at graduation. Flowsheet Row Cardiac Rehab  from 09/15/2023 in [facility] Cardiac and Pulmonary Rehab  Education need identified 08/16/23       Education: Resistance Exercise: - Group verbal and visual presentation on the components of exercise prescription. Introduces F.I.T.T principle from ACSM for exercise prescriptions  Reviews F.I.T.T. principles of resistance exercise including progression. Written material given at graduation.    Education: Exercise & Equipment Safety: - Individual verbal instruction and demonstration of equipment use and safety with use of the equipment. Flowsheet Row Cardiac Rehab from 09/15/2023 in [facility] Cardiac and Pulmonary Rehab  Date 08/16/23  Educator [facility]  Instruction Review Code 1- Verbalizes Understanding       Education: Exercise Physiology & General Exercise Guidelines: - Group verbal and written instruction with models to review the exercise physiology of the cardiovascular system and associated critical values. Provides general exercise guidelines with specific guidelines to those with heart or lung disease.    Education: Flexibility, Balance, Mind/Body Relaxation: - Group verbal and visual presentation with interactive activity on the components of exercise prescription. Introduces F.I.T.T principle from ACSM for exercise prescriptions. Reviews F.I.T.T. principles of flexibility and balance exercise training including progression. Also discusses the mind body connection.  Reviews various relaxation techniques to help reduce and manage stress (i.e. Deep breathing, progressive muscle relaxation, and visualization). Balance handout provided to take home. Written material given at graduation.   Activity Barriers & Risk Stratification:  Activity Barriers & Cardiac Risk Stratification - 08/16/23 1527       Activity Barriers & Cardiac Risk Stratification   Activity Barriers Other (comment)    Comments spells of verdigo    Cardiac Risk Stratification High             6 Minute Walk:  6 Minute  Walk     Row Name 08/16/23 1526         6 Minute Walk   Phase Initial     Distance 1150 feet     Walk Time 6 minutes     # of Rest Breaks 0     MPH 2.18     METS 2.12     RPE 15     Perceived Dyspnea  1     VO2 Peak 7.42     Symptoms No     Resting HR 71 bpm     Resting BP 138/70     Resting Oxygen Saturation  95 %     Exercise Oxygen Saturation  during 6 min walk 93 %     Max Ex. HR 99 bpm     Max Ex. BP 142/82     2 Minute Post BP 132/80              Oxygen Initial Assessment:   Oxygen Re-Evaluation:   Oxygen Discharge (Final Oxygen Re-Evaluation):   Initial Exercise Prescription:  Initial Exercise Prescription - 08/16/23 1500  Date of Initial Exercise RX and Referring Provider   Date 08/16/23    Referring Provider Dr. Trixie Rude      Oxygen   Maintain Oxygen Saturation 88% or higher      Treadmill   MPH 1.5    Grade 0    Minutes 15    METs 2.15      Recumbant Bike   Level 2    RPM 50    Watts 15    Minutes 15    METs 2.12      REL-XR   Level 2    Speed 50    Minutes 15    METs 2.12      T5 Nustep   Level 2    SPM 80    Minutes 15    METs 2.12      Biostep-RELP   Level 2    SPM 50    Minutes 15    METs 2.12      Track   Laps 20    Minutes 15    METs 2.09      Prescription Details   Frequency (times per week) 3    Duration Progress to 30 minutes of continuous aerobic without signs/symptoms of physical distress      Intensity   THRR 40-80% of Max Heartrate 100-129    Ratings of Perceived Exertion 11-13    Perceived Dyspnea 0-4      Progression   Progression Continue to progress workloads to maintain intensity without signs/symptoms of physical distress.      Resistance Training   Training Prescription Yes    Weight 3    Reps 10-15             Perform Capillary Blood Glucose checks as needed.  Exercise Prescription Changes:   Exercise Prescription Changes     Row Name 08/16/23 1500 08/31/23  1400 09/16/23 1700         Response to Exercise   Blood Pressure (Admit) 138/70 132/70 122/68     Blood Pressure (Exercise) 142/82 122/70 138/68     Blood Pressure (Exit) 132/80 110/58 118/64     Heart Rate (Admit) 71 bpm 97 bpm 75 bpm     Heart Rate (Exercise) 99 bpm 97 bpm 110 bpm     Heart Rate (Exit) 81 bpm 82 bpm 80 bpm     Oxygen Saturation (Admit) 95 % -- --     Oxygen Saturation (Exercise) 93 % -- --     Oxygen Saturation (Exit) 95 % -- --     Rating of Perceived Exertion (Exercise) 15 13 15      Perceived Dyspnea (Exercise) 1 -- 0     Symptoms none -- none     Comments 6 MWT results -- --     Duration -- Continue with 30 min of aerobic exercise without signs/symptoms of physical distress. Continue with 30 min of aerobic exercise without signs/symptoms of physical distress.     Intensity -- THRR unchanged THRR unchanged       Progression   Progression -- Continue to progress workloads to maintain intensity without signs/symptoms of physical distress. Continue to progress workloads to maintain intensity without signs/symptoms of physical distress.     Average METs -- 2 2.2       Resistance Training   Training Prescription -- Yes Yes     Weight -- 3 3     Reps -- 10-15 10-15  Interval Training   Interval Training -- No No       Treadmill   MPH -- -- 1.8     Grade -- -- 0     Minutes -- -- 15     METs -- -- 2.38       Recumbant Bike   Level -- -- 2     Watts -- -- 15     Minutes -- -- 15     METs -- -- 2.67       REL-XR   Level -- -- 2     Minutes -- -- 15     METs -- -- 3.1       T5 Nustep   Level -- 2 3     SPM -- 80 --     Minutes -- 15 15     METs -- 2 1.9       Biostep-RELP   Level -- 1 1     SPM -- 50 --     Minutes -- 15 15     METs -- 2 2       Oxygen   Maintain Oxygen Saturation -- -- 88% or higher              Exercise Comments:   Exercise Comments     Row Name 08/25/23 1036           Exercise Comments First full day  of exercise!  Patient was oriented to gym and equipment including functions, settings, policies, and procedures.  Patient's individual exercise prescription and treatment plan were reviewed.  All starting workloads were established based on the results of the 6 minute walk test done at initial orientation visit.  The plan for exercise progression was also introduced and progression will be customized based on patient's performance and goals.                Exercise Goals and Review:   Exercise Goals     Row Name 08/16/23 1536             Exercise Goals   Increase Physical Activity Yes       Intervention Provide advice, education, support and counseling about physical activity/exercise needs.;Develop an individualized exercise prescription for aerobic and resistive training based on initial evaluation findings, risk stratification, comorbidities and participant's personal goals.       Expected Outcomes Short Term: Attend rehab on a regular basis to increase amount of physical activity.;Long Term: Add in home exercise to make exercise part of routine and to increase amount of physical activity.;Long Term: Exercising regularly at least 3-5 days a week.       Increase Strength and Stamina Yes       Intervention Provide advice, education, support and counseling about physical activity/exercise needs.;Develop an individualized exercise prescription for aerobic and resistive training based on initial evaluation findings, risk stratification, comorbidities and participant's personal goals.       Expected Outcomes Short Term: Increase workloads from initial exercise prescription for resistance, speed, and METs.;Short Term: Perform resistance training exercises routinely during rehab and add in resistance training at home;Long Term: Improve cardiorespiratory fitness, muscular endurance and strength as measured by increased METs and functional capacity ( )       Able to understand and use rate of  perceived exertion (RPE) scale Yes       Intervention Provide education and explanation on how to use RPE scale       Expected Outcomes Short  Term: Able to use RPE daily in rehab to express subjective intensity level;Long Term:  Able to use RPE to guide intensity level when exercising independently       Able to understand and use Dyspnea scale Yes       Intervention Provide education and explanation on how to use Dyspnea scale       Expected Outcomes Short Term: Able to use Dyspnea scale daily in rehab to express subjective sense of shortness of breath during exertion;Long Term: Able to use Dyspnea scale to guide intensity level when exercising independently       Knowledge and understanding of Target Heart Rate Range (THRR) Yes       Intervention Provide education and explanation of THRR including how the numbers were predicted and where they are located for reference       Expected Outcomes Short Term: Able to state/look up THRR;Long Term: Able to use THRR to govern intensity when exercising independently;Short Term: Able to use daily as guideline for intensity in rehab       Able to check pulse independently Yes       Intervention Provide education and demonstration on how to check pulse in carotid and radial arteries.;Review the importance of being able to check your own pulse for safety during independent exercise       Expected Outcomes Short Term: Able to explain why pulse checking is important during independent exercise;Long Term: Able to check pulse independently and accurately       Understanding of Exercise Prescription Yes       Intervention Provide education, explanation, and written materials on patient's individual exercise prescription       Expected Outcomes Short Term: Able to explain program exercise prescription;Long Term: Able to explain home exercise prescription to exercise independently                Exercise Goals Re-Evaluation :  Exercise Goals Re-Evaluation      Row Name 08/25/23 1036 08/31/23 1444 09/16/23 1709         Exercise Goal Re-Evaluation   Exercise Goals Review Able to understand and use rate of perceived exertion (RPE) scale;Able to understand and use Dyspnea scale;Knowledge and understanding of Target Heart Rate Range (THRR);Understanding of Exercise Prescription Increase Physical Activity;Understanding of Exercise Prescription;Increase Strength and Stamina Increase Physical Activity;Understanding of Exercise Prescription;Increase Strength and Stamina     Comments Reviewed RPE and dyspnea scale, THR and program prescription with pt today.  Pt voiced understanding and was given a copy of goals to take home. Kejuana has completed her first session. She tolerated her current exercise prescription well with level 1 on the biostep, level 2 on the T5 nustep, and 3 lb weights. We will continue to monitor her progress in the program. Tahjanae has been doing well in rehab. She was able to increase her level on the T5 nustep from level 2 to 3. She was also able to increase her speed on the treadmil from 1.5 mph to 1.85mph. We will continue to monitor her progress in the program.     Expected Outcomes Short: Use RPE daily to regulate intensity. Long: Follow program prescription in THR. Short: Continue to follow current exercise prescription. Long: Continue exercise to improve strength and stamina. Short: Continue to follow current exercise prescription. Long: Continue exercise to improve strength and stamina.              Discharge Exercise Prescription (Final Exercise Prescription Changes):  Exercise Prescription Changes -  09/16/23 1700       Response to Exercise   Blood Pressure (Admit) 122/68    Blood Pressure (Exercise) 138/68    Blood Pressure (Exit) 118/64    Heart Rate (Admit) 75 bpm    Heart Rate (Exercise) 110 bpm    Heart Rate (Exit) 80 bpm    Rating of Perceived Exertion (Exercise) 15    Perceived Dyspnea (Exercise) 0    Symptoms none     Duration Continue with 30 min of aerobic exercise without signs/symptoms of physical distress.    Intensity THRR unchanged      Progression   Progression Continue to progress workloads to maintain intensity without signs/symptoms of physical distress.    Average METs 2.2      Resistance Training   Training Prescription Yes    Weight 3    Reps 10-15      Interval Training   Interval Training No      Treadmill   MPH 1.8    Grade 0    Minutes 15    METs 2.38      Recumbant Bike   Level 2    Watts 15    Minutes 15    METs 2.67      REL-XR   Level 2    Minutes 15    METs 3.1      T5 Nustep   Level 3    Minutes 15    METs 1.9      Biostep-RELP   Level 1    Minutes 15    METs 2      Oxygen   Maintain Oxygen Saturation 88% or higher             Nutrition:  Target Goals: Understanding of nutrition guidelines, daily intake of sodium 1500mg , cholesterol 200mg , calories 30% from fat and 7% or less from saturated fats, daily to have 5 or more servings of fruits and vegetables.  Education: All About Nutrition: -Group instruction provided by verbal, written material, interactive activities, discussions, models, and posters to present general guidelines for heart healthy nutrition including fat, fiber, MyPlate, the role of sodium in heart healthy nutrition, utilization of the nutrition label, and utilization of this knowledge for meal planning. Follow up email sent as well. Written material given at graduation.   Biometrics:  Pre Biometrics - 08/16/23 1542       Pre Biometrics   Height 5' (1.524 m)    Weight 154 lb (69.9 kg)    Waist Circumference 40 inches    Hip Circumference 44.5 inches    Waist to Hip Ratio 0.9 %    BMI (Calculated) 30.08    Single Leg Stand 4.66 seconds              Nutrition Therapy Plan and Nutrition Goals:   Nutrition Assessments:  MEDIFICTS Score Key: >=70 Need to make dietary changes  40-70 Heart Healthy Diet <= 40  Therapeutic Level Cholesterol Diet  Flowsheet Row Cardiac Rehab from 08/16/2023 in [facility] Cardiac and Pulmonary Rehab  Picture Your Plate Total Score on Admission 75      Picture Your Plate Scores: <44 Unhealthy dietary pattern with much room for improvement. 41-50 Dietary pattern unlikely to meet recommendations for good health and room for improvement. 51-60 More healthful dietary pattern, with some room for improvement.  >60 Healthy dietary pattern, although there may be some specific behaviors that could be improved.    Nutrition Goals Re-Evaluation:   Nutrition Goals  Discharge (Final Nutrition Goals Re-Evaluation):   Psychosocial: Target Goals: Acknowledge presence or absence of significant depression and/or stress, maximize coping skills, provide positive support system. Participant is able to verbalize types and ability to use techniques and skills needed for reducing stress and depression.   Education: Stress, Anxiety, and Depression - Group verbal and visual presentation to define topics covered.  Reviews how body is impacted by stress, anxiety, and depression.  Also discusses healthy ways to reduce stress and to treat/manage anxiety and depression.  Written material given at graduation.   Education: Sleep Hygiene -Provides group verbal and written instruction about how sleep can affect your health.  Define sleep hygiene, discuss sleep cycles and impact of sleep habits. Review good sleep hygiene tips.    Initial Review & Psychosocial Screening:  Initial Psych Review & Screening - 08/12/23 1017       Initial Review   Current issues with None Identified      Family Dynamics   Good Support System? Yes    Comments Ellec has a good family support system and can look to her five children for support. She does not take any medications for her mood.      Barriers   Psychosocial barriers to participate in program The patient should benefit from training in stress management  and relaxation.;There are no identifiable barriers or psychosocial needs.      Screening Interventions   Interventions To provide support and resources with identified psychosocial needs;Encouraged to exercise;Provide feedback about the scores to participant    Expected Outcomes Short Term goal: Utilizing psychosocial counselor, staff and physician to assist with identification of specific Stressors or current issues interfering with healing process. Setting desired goal for each stressor or current issue identified.;Long Term Goal: Stressors or current issues are controlled or eliminated.;Short Term goal: Identification and review with participant of any Quality of Life or Depression concerns found by scoring the questionnaire.;Long Term goal: The participant improves quality of Life and PHQ9 Scores as seen by post scores and/or verbalization of changes             Quality of Life Scores:   Quality of Life - 08/16/23 1545       Quality of Life   Select Quality of Life      Quality of Life Scores   Health/Function Pre 23.9 %    Socioeconomic Pre 22.5 %    Psych/Spiritual Pre 24.29 %    Family Pre 25.8 %    GLOBAL Pre 23.93 %            Scores of 19 and below usually indicate a poorer quality of life in these areas.  A difference of  2-3 points is a clinically meaningful difference.  A difference of 2-3 points in the total score of the Quality of Life Index has been associated with significant improvement in overall quality of life, self-image, physical symptoms, and general health in studies assessing change in quality of life.  PHQ-9: Review Flowsheet       08/16/2023  Depression screen PHQ 2/9  Decreased Interest 0  Down, Depressed, Hopeless 0  PHQ - 2 Score 0  Altered sleeping 1  Tired, decreased energy 1  Change in appetite 0  Feeling bad or failure about yourself  0  Trouble concentrating 0  Moving slowly or fidgety/restless 0  Suicidal thoughts 0  PHQ-9 Score 2   Difficult doing work/chores Not difficult at all   Interpretation of Total Score  Total  Score Depression Severity:  1-4 = Minimal depression, 5-9 = Mild depression, 10-14 = Moderate depression, 15-19 = Moderately severe depression, 20-27 = Severe depression   Psychosocial Evaluation and Intervention:  Psychosocial Evaluation - 08/12/23 1018       Psychosocial Evaluation & Interventions   Interventions Relaxation education;Stress management education;Encouraged to exercise with the program and follow exercise prescription    Comments Ellec has a good family support system and can look to her five children for support. She does not take any medications for her mood.    Expected Outcomes Short: Start HeartTrack to help with mood. Long: Maintain a healthy mental state    Continue Psychosocial Services  Follow up required by staff             Psychosocial Re-Evaluation:   Psychosocial Discharge (Final Psychosocial Re-Evaluation):   Vocational Rehabilitation: Provide vocational rehab assistance to qualifying candidates.   Vocational Rehab Evaluation & Intervention:   Education: Education Goals: Education classes will be provided on a variety of topics geared toward better understanding of heart health and risk factor modification. Participant will state understanding/return demonstration of topics presented as noted by education test scores.  Learning Barriers/Preferences:  Learning Barriers/Preferences - 08/12/23 1016       Learning Barriers/Preferences   Learning Barriers None    Learning Preferences None             General Cardiac Education Topics:  AED/CPR: - Group verbal and written instruction with the use of models to demonstrate the basic use of the AED with the basic ABC's of resuscitation.   Anatomy and Cardiac Procedures: - Group verbal and visual presentation and models provide information about basic cardiac anatomy and function. Reviews the testing  methods done to diagnose heart disease and the outcomes of the test results. Describes the treatment choices: Medical Management, Angioplasty, or Coronary Bypass Surgery for treating various heart conditions including Myocardial Infarction, Angina, Valve Disease, and Cardiac Arrhythmias.  Written material given at graduation. Flowsheet Row Cardiac Rehab from 09/15/2023 in [facility] Cardiac and Pulmonary Rehab  Education need identified 08/16/23  Date 08/25/23  Educator SB  Instruction Review Code 1- Verbalizes Understanding       Medication Safety: - Group verbal and visual instruction to review commonly prescribed medications for heart and lung disease. Reviews the medication, class of the drug, and side effects. Includes the steps to properly store meds and maintain the prescription regimen.  Written material given at graduation. Flowsheet Row Cardiac Rehab from 09/15/2023 in [facility] Cardiac and Pulmonary Rehab  Date 09/01/23  Educator SB  Instruction Review Code 1- Verbalizes Understanding       Intimacy: - Group verbal instruction through game format to discuss how heart and lung disease can affect sexual intimacy. Written material given at graduation..   Know Your Numbers and Heart Failure: - Group verbal and visual instruction to discuss disease risk factors for cardiac and pulmonary disease and treatment options.  Reviews associated critical values for Overweight/Obesity, Hypertension, Cholesterol, and Diabetes.  Discusses basics of heart failure: signs/symptoms and treatments.  Introduces Heart Failure Zone chart for action plan for heart failure.  Written material given at graduation. Flowsheet Row [facility]  Date 09/08/23  Educator SB  Instruction Review Code 1- Verbalizes Understanding       Infection Prevention: - Provides verbal and written material to individual with discussion of infection control including proper hand  washing and proper equipment cleaning during  exercise session. Flowsheet Row Cardiac Rehab from 09/15/2023 in [facility] Cardiac and Pulmonary Rehab  Date 08/16/23  Educator [facility]  Instruction Review Code 1- Verbalizes Understanding       Falls Prevention: - Provides verbal and written material to individual with discussion of falls prevention and safety. Flowsheet Row Cardiac Rehab from 09/15/2023 in [facility] Cardiac and Pulmonary Rehab  Date 08/16/23  Educator [facility]  Instruction Review Code 1- Verbalizes Understanding       Other: -Provides group and verbal instruction on various topics (see comments)   Knowledge Questionnaire Score:  Knowledge Questionnaire Score - 08/16/23 1544       Knowledge Questionnaire Score   Pre Score 24/26             Core Components/Risk Factors/Patient Goals at Admission:  Personal Goals and Risk Factors at Admission - 08/16/23 1546       Core Components/Risk Factors/Patient Goals on Admission    Weight Management Yes;Weight Loss    Intervention Weight Management: Develop a combined nutrition and exercise program designed to reach desired caloric intake, while maintaining appropriate intake of nutrient and fiber, sodium and fats, and appropriate energy expenditure required for the weight goal.;Weight Management: Provide education and appropriate resources to help participant work on and attain dietary goals.;Weight Management/Obesity: Establish reasonable short term and long term weight goals.    Admit Weight 154 lb (69.9 kg)    Goal Weight: Short Term 148 lb (67.1 kg)    Goal Weight: Long Term 140 lb (63.5 kg)    Expected Outcomes Short Term: Continue to assess and modify interventions until short term weight is achieved;Long Term: Adherence to nutrition and physical activity/exercise program aimed toward attainment of established weight goal;Weight Loss: Understanding of general recommendations for a balanced deficit meal plan, which promotes 1-2 lb  weight loss per week and includes a negative energy balance of [phone] kcal/d;Understanding recommendations for meals to include 15-35% energy as protein, 25-35% energy from fat, 35-60% energy from carbohydrates, less than 200mg  of dietary cholesterol, 20-35 gm of total fiber daily;Understanding of distribution of calorie intake throughout the day with the consumption of 4-5 meals/snacks    Diabetes Yes    Intervention Provide education about signs/symptoms and action to take for hypo/hyperglycemia.;Provide education about proper nutrition, including hydration, and aerobic/resistive exercise prescription along with prescribed medications to achieve blood glucose in normal ranges: Fasting glucose 65-99 mg/dL    Expected Outcomes Short Term: Participant verbalizes understanding of the signs/symptoms and immediate care of hyper/hypoglycemia, proper foot care and importance of medication, aerobic/resistive exercise and nutrition plan for blood glucose control.;Long Term: Attainment of HbA1C < 7%.    Heart Failure Yes    Intervention Provide a combined exercise and nutrition program that is supplemented with education, support and counseling about heart failure. Directed toward relieving symptoms such as shortness of breath, decreased exercise tolerance, and extremity edema.    Expected Outcomes Improve functional capacity of life;Short term: Attendance in program 2-3 days a week with increased exercise capacity. Reported lower sodium intake. Reported increased fruit and vegetable intake. Reports medication compliance.;Short term: Daily weights obtained and reported for increase. Utilizing diuretic protocols set by physician.;Long term: Adoption of self-care skills and reduction of barriers for early signs and symptoms recognition and intervention leading to self-care maintenance.    Hypertension Yes    Intervention Provide education on lifestyle modifcations including regular physical activity/exercise, weight  management, moderate sodium restriction and increased consumption of fresh fruit, vegetables, and low fat dairy, alcohol  moderation, and smoking cessation.;Monitor prescription use compliance.    Expected Outcomes Short Term: Continued assessment and intervention until BP is < 140/10mm HG in hypertensive participants. < 130/25mm HG in hypertensive participants with diabetes, heart failure or chronic kidney disease.;Long Term: Maintenance of blood pressure at goal levels.    Lipids Yes    Intervention Provide education and support for participant on nutrition & aerobic/resistive exercise along with prescribed medications to achieve LDL 70mg , HDL >40mg .    Expected Outcomes Short Term: Participant states understanding of desired cholesterol values and is compliant with medications prescribed. Participant is following exercise prescription and nutrition guidelines.;Long Term: Cholesterol controlled with medications as prescribed, with individualized exercise RX and with personalized nutrition plan. Value goals: LDL < 70mg , HDL > 40 mg.             Education:Diabetes - Individual verbal and written instruction to review signs/symptoms of diabetes, desired ranges of glucose level fasting, after meals and with exercise. Acknowledge that pre and post exercise glucose checks will be done for 3 sessions at entry of program. Flowsheet Row Cardiac Rehab from 09/15/2023 in [facility] Cardiac and Pulmonary Rehab  Date 08/16/23  Educator [facility]  Instruction Review Code 1- Verbalizes Understanding       Core Components/Risk Factors/Patient Goals Review:    Core Components/Risk Factors/Patient Goals at Discharge (Final Review):    ITP Comments:  ITP Comments     Row Name 08/12/23 1015 08/16/23 1525 08/25/23 1035 08/25/23 1124 09/22/23 0940   ITP Comments Virtual Visit completed. Patient informed on EP and RD appointment and 6 Minute walk test. Patient also informed of patient health questionnaires on My  Chart. Patient Verbalizes understanding. Visit diagnosis can be found in [facility] 07/06/2023. Completed and gym orientation. Initial ITP created and sent for review to Dr. Bethann Punches, Medical Director. First full day of exercise!  Patient was oriented to gym and equipment including functions, settings, policies, and procedures.  Patient's individual exercise prescription and treatment plan were reviewed.  All starting workloads were established based on the results of the 6 minute walk test done at initial orientation visit.  The plan for exercise progression was also introduced and progression will be customized based on patient's performance and goals. 30 Day review completed. Medical Director ITP review done, changes made as directed, and signed approval by Medical Director.    new to program 30 Day review completed. Medical Director ITP review done, changes made as directed, and signed approval by Medical Director.            Comments:

## 2023-09-24 ENCOUNTER — Encounter: Payer: PPO | Admitting: *Deleted

## 2023-09-24 DIAGNOSIS — Z5189 Encounter for other specified aftercare: Secondary | ICD-10-CM | POA: Diagnosis not present

## 2023-09-24 DIAGNOSIS — Z951 Presence of aortocoronary bypass graft: Secondary | ICD-10-CM

## 2023-09-24 NOTE — Progress Notes (Signed)
Daily Session Note  Patient Details  Name: Rebekah Gibson MRN: [id number] Date of Birth: 11-07-1945 Referring Provider:   Flowsheet Row Cardiac Rehab from 08/16/2023 in [facility] Cardiac and Pulmonary Rehab  Referring Provider Dr. Trixie Rude       Encounter Date: 09/24/2023  Check In:  Session Check In - 09/24/23 1124       Check-In   Supervising physician immediately available to respond to emergencies See telemetry face sheet for immediately available ER MD    Location [facility]-Cardiac & Pulmonary Rehab    Staff Present Elige Ko, RCP,RRT,BSRT;Noah Tickle, BS, Exercise Physiologist    Virtual Visit No    Medication changes reported     No    Fall or balance concerns reported    No    Warm-up and Cool-down Performed on first and last piece of equipment    Resistance Training Performed Yes    VAD Patient? No    PAD/SET Patient? No      Pain Assessment   Currently in Pain? No/denies                Social History   Tobacco Use  Smoking Status Never  Smokeless Tobacco Never    Goals Met:  Independence with exercise equipment Exercise tolerated well No report of concerns or symptoms today  Goals Unmet:  Not Applicable  Comments: Pt able to follow exercise prescription today without complaint.  Will continue to monitor for progression.    Dr. Bethann Punches is Medical Director for [facility] Cardiac Rehabilitation.  Dr. Vida Rigger is Medical Director for [facility] Pulmonary Rehabilitation.

## 2023-09-27 ENCOUNTER — Encounter: Payer: PPO | Admitting: *Deleted

## 2023-09-27 DIAGNOSIS — Z5189 Encounter for other specified aftercare: Secondary | ICD-10-CM | POA: Diagnosis not present

## 2023-09-27 DIAGNOSIS — Z951 Presence of aortocoronary bypass graft: Secondary | ICD-10-CM

## 2023-09-27 NOTE — Progress Notes (Signed)
Daily Session Note  Patient Details  Name: Rebekah Gibson MRN: [id number] Date of Birth: 1946/02/24 Referring Provider:   Flowsheet Row Cardiac Rehab from 08/16/2023 in [facility] Cardiac and Pulmonary Rehab  Referring Provider Dr. Trixie Rude       Encounter Date: 09/27/2023  Check In:  Session Check In - 09/27/23 1333       Check-In   Supervising physician immediately available to respond to emergencies See telemetry face sheet for immediately available ER MD    Staff Present Rebekah Collum, RN, BSN, CCRP;Rebekah Coble, RN,BC,MSN;Rebekah Best, MS, Exercise Physiologist;Rebekah Katrinka Blazing, RN, Rebekah Gibson, BS, ACSM CEP, Exercise Physiologist    Virtual Visit No    Medication changes reported     No    Fall or balance concerns reported    No    Warm-up and Cool-down Performed on first and last piece of equipment    Resistance Training Performed Yes    VAD Patient? No    PAD/SET Patient? No      Pain Assessment   Currently in Pain? No/denies                Social History   Tobacco Use  Smoking Status Never  Smokeless Tobacco Never    Goals Met:  Independence with exercise equipment Exercise tolerated well No report of concerns or symptoms today  Goals Unmet:  Not Applicable  Comments: Pt able to follow exercise prescription today without complaint.  Will continue to monitor for progression.    Dr. Bethann Punches is Medical Director for [facility] Cardiac Rehabilitation.  Dr. Vida Rigger is Medical Director for [facility] Pulmonary Rehabilitation.

## 2023-10-01 ENCOUNTER — Encounter: Payer: PPO | Admitting: *Deleted

## 2023-10-01 DIAGNOSIS — Z951 Presence of aortocoronary bypass graft: Secondary | ICD-10-CM

## 2023-10-01 DIAGNOSIS — Z5189 Encounter for other specified aftercare: Secondary | ICD-10-CM | POA: Diagnosis not present

## 2023-10-01 NOTE — Progress Notes (Signed)
Daily Session Note  Patient Details  Name: Rebekah Gibson MRN: [id number] Date of Birth: 1946-05-07 Referring Provider:   Flowsheet Row Cardiac Rehab from 08/16/2023 in [facility] Cardiac and Pulmonary Rehab  Referring Provider Dr. Trixie Rude       Encounter Date: 10/01/2023  Check In:  Session Check In - 10/01/23 1114       Check-In   Supervising physician immediately available to respond to emergencies See telemetry face sheet for immediately available ER MD    Location [facility]-Cardiac & Pulmonary Rehab    Staff Present Cora Collum, RN, BSN, CCRP;Joseph Hood, RCP,RRT,BSRT;Noah Tickle, [name], Exercise Physiologist    Virtual Visit No    Medication changes reported     No    Fall or balance concerns reported    No    Warm-up and Cool-down Performed on first and last piece of equipment    Resistance Training Performed Yes    VAD Patient? No    PAD/SET Patient? No      Pain Assessment   Currently in Pain? No/denies                Social History   Tobacco Use  Smoking Status Never  Smokeless Tobacco Never    Goals Met:  Independence with exercise equipment Exercise tolerated well No report of concerns or symptoms today  Goals Unmet:  Not Applicable  Comments: Pt able to follow exercise prescription today without complaint.  Will continue to monitor for progression.    Dr. Bethann Punches is Medical Director for [facility] Cardiac Rehabilitation.  Dr. Vida Rigger is Medical Director for [facility] Pulmonary Rehabilitation.

## 2023-10-04 ENCOUNTER — Encounter: Payer: PPO | Admitting: *Deleted

## 2023-10-04 DIAGNOSIS — Z5189 Encounter for other specified aftercare: Secondary | ICD-10-CM | POA: Diagnosis not present

## 2023-10-04 DIAGNOSIS — Z951 Presence of aortocoronary bypass graft: Secondary | ICD-10-CM

## 2023-10-04 NOTE — Progress Notes (Signed)
Daily Session Note  Patient Details  Name: Rebekah Gibson MRN: [id number] Date of Birth: 1945-11-04 Referring Provider:   Flowsheet Row Cardiac Rehab from 08/16/2023 in [facility] Cardiac and Pulmonary Rehab  Referring Provider Dr. Trixie Rude       Encounter Date: 10/04/2023  Check In:  Session Check In - 10/04/23 1114       Check-In   Supervising physician immediately available to respond to emergencies See telemetry face sheet for immediately available ER MD    Location [facility]-Cardiac & Pulmonary Rehab    Staff Present Ronette Deter, BS, Exercise Physiologist;Susanne Bice, RN, BSN, CCRP;Meredith Jewel Baize, RN Atilano Median, RN, ADN    Virtual Visit No    Medication changes reported     No    Fall or balance concerns reported    No    Warm-up and Cool-down Performed on first and last piece of equipment    Resistance Training Performed Yes    VAD Patient? No    PAD/SET Patient? No      Pain Assessment   Currently in Pain? No/denies                Social History   Tobacco Use  Smoking Status Never  Smokeless Tobacco Never    Goals Met:  Independence with exercise equipment Exercise tolerated well No report of concerns or symptoms today Strength training completed today  Goals Unmet:  Not Applicable  Comments: Pt able to follow exercise prescription today without complaint.  Will continue to monitor for progression.    Dr. Bethann Punches is Medical Director for [facility] Cardiac Rehabilitation.  Dr. Vida Rigger is Medical Director for [facility] Pulmonary Rehabilitation.

## 2023-10-08 ENCOUNTER — Encounter: Payer: PPO | Attending: Internal Medicine | Admitting: *Deleted

## 2023-10-08 DIAGNOSIS — Z951 Presence of aortocoronary bypass graft: Secondary | ICD-10-CM | POA: Diagnosis not present

## 2023-10-08 DIAGNOSIS — Z48812 Encounter for surgical aftercare following surgery on the circulatory system: Secondary | ICD-10-CM | POA: Diagnosis not present

## 2023-10-08 NOTE — Progress Notes (Signed)
 Daily Session Note  Patient Details  Name: Rebekah Gibson MRN: [id number] Date of Birth: 29-Jan-1946 Referring Provider:   Flowsheet Row Cardiac Rehab from 08/16/2023 in [facility] Cardiac and Pulmonary Rehab  Referring Provider Dr. Kelton Chester       Encounter Date: 10/08/2023  Check In:  Session Check In - 10/08/23 1133       Check-In   Supervising physician immediately available to respond to emergencies See telemetry face sheet for immediately available ER MD    Location [facility]-Cardiac & Pulmonary Rehab    Staff Present Othel Durand, RN, BSN, CCRP;Noah Tickle, BS, Exercise Physiologist;Maxon Conetta BS, , Exercise Physiologist    Virtual Visit No    Medication changes reported     Yes    Comments stopped Repatha  per MD    Fall or balance concerns reported    No    Warm-up and Cool-down Performed on first and last piece of equipment    Resistance Training Performed Yes    VAD Patient? No    PAD/SET Patient? No      Pain Assessment   Currently in Pain? No/denies                Social History   Tobacco Use  Smoking Status Never  Smokeless Tobacco Never    Goals Met:  Independence with exercise equipment Exercise tolerated well No report of concerns or symptoms today  Goals Unmet:  Not Applicable  Comments: Pt able to follow exercise prescription today without complaint.  Will continue to monitor for progression.    Dr. Oneil Pinal is Medical Director for [facility] Cardiac Rehabilitation.  Dr. Fuad Aleskerov is Medical Director for [facility] Pulmonary Rehabilitation.

## 2023-10-11 ENCOUNTER — Encounter: Payer: PPO | Admitting: *Deleted

## 2023-10-11 DIAGNOSIS — Z951 Presence of aortocoronary bypass graft: Secondary | ICD-10-CM

## 2023-10-11 DIAGNOSIS — Z48812 Encounter for surgical aftercare following surgery on the circulatory system: Secondary | ICD-10-CM | POA: Diagnosis not present

## 2023-10-11 NOTE — Progress Notes (Signed)
 Daily Session Note  Patient Details  Name: Rebekah Gibson MRN: [id number] Date of Birth: 10-Sep-1946 Referring Provider:   Flowsheet Row Cardiac Rehab from 08/16/2023 in [facility] Cardiac and Pulmonary Rehab  Referring Provider Dr. Kelton Chester       Encounter Date: 10/11/2023  Check In:  Session Check In - 10/11/23 1133       Check-In   Supervising physician immediately available to respond to emergencies See telemetry face sheet for immediately available ER MD    Location [facility]-Cardiac & Pulmonary Rehab    Staff Present Rollene Paterson, MS, Exercise Physiologist;Maxon Burnell HECKLE, , Exercise Physiologist;Kelly Dyane, BS, ACSM CEP, Exercise Physiologist;Mairyn Lenahan Claudene, RN, ADN    Virtual Visit No    Medication changes reported     No    Fall or balance concerns reported    No    Warm-up and Cool-down Performed on first and last piece of equipment    Resistance Training Performed Yes    VAD Patient? No    PAD/SET Patient? No      Pain Assessment   Currently in Pain? No/denies                Social History   Tobacco Use  Smoking Status Never  Smokeless Tobacco Never    Goals Met:  Independence with exercise equipment Exercise tolerated well No report of concerns or symptoms today Strength training completed today  Goals Unmet:  Not Applicable  Comments: Pt able to follow exercise prescription today without complaint.  Will continue to monitor for progression.    Dr. Oneil Pinal is Medical Director for [facility] Cardiac Rehabilitation.  Dr. Fuad Aleskerov is Medical Director for [facility] Pulmonary Rehabilitation.

## 2023-10-13 ENCOUNTER — Encounter: Payer: PPO | Admitting: *Deleted

## 2023-10-13 DIAGNOSIS — Z48812 Encounter for surgical aftercare following surgery on the circulatory system: Secondary | ICD-10-CM | POA: Diagnosis not present

## 2023-10-13 DIAGNOSIS — Z951 Presence of aortocoronary bypass graft: Secondary | ICD-10-CM

## 2023-10-13 NOTE — Progress Notes (Signed)
 Daily Session Note  Patient Details  Name: Rebekah Gibson MRN: [id number] Date of Birth: 06/19/1946 Referring Provider:   Flowsheet Row Cardiac Rehab from 08/16/2023 in [facility] Cardiac and Pulmonary Rehab  Referring Provider Dr. Kelton Chester       Encounter Date: 10/13/2023  Check In:  Session Check In - 10/13/23 1052       Check-In   Supervising physician immediately available to respond to emergencies See telemetry face sheet for immediately available ER MD    Location [facility]-Cardiac & Pulmonary Rehab    Staff Present Fairy Plater, RCP,RRT,BSRT;Meredith Tressa, RN Monico Hint, BS, ACSM CEP, Exercise Physiologist;Terron Merfeld Claudene, RN, ADN    Virtual Visit No    Medication changes reported     No    Fall or balance concerns reported    No    Warm-up and Cool-down Performed on first and last piece of equipment    Resistance Training Performed Yes    VAD Patient? No    PAD/SET Patient? No      Pain Assessment   Currently in Pain? No/denies                Social History   Tobacco Use  Smoking Status Never  Smokeless Tobacco Never    Goals Met:  Independence with exercise equipment Exercise tolerated well No report of concerns or symptoms today Strength training completed today  Goals Unmet:  Not Applicable  Comments: Pt able to follow exercise prescription today without complaint.  Will continue to monitor for progression.    Dr. Oneil Pinal is Medical Director for [facility] Cardiac Rehabilitation.  Dr. Fuad Aleskerov is Medical Director for [facility] Pulmonary Rehabilitation.

## 2023-10-14 DIAGNOSIS — Z7984 Long term (current) use of oral hypoglycemic drugs: Secondary | ICD-10-CM | POA: Diagnosis not present

## 2023-10-14 DIAGNOSIS — H40023 Open angle with borderline findings, high risk, bilateral: Secondary | ICD-10-CM | POA: Diagnosis not present

## 2023-10-14 DIAGNOSIS — H52223 Regular astigmatism, bilateral: Secondary | ICD-10-CM | POA: Diagnosis not present

## 2023-10-14 DIAGNOSIS — H524 Presbyopia: Secondary | ICD-10-CM | POA: Diagnosis not present

## 2023-10-14 DIAGNOSIS — H5213 Myopia, bilateral: Secondary | ICD-10-CM | POA: Diagnosis not present

## 2023-10-14 DIAGNOSIS — E119 Type 2 diabetes mellitus without complications: Secondary | ICD-10-CM | POA: Diagnosis not present

## 2023-10-15 ENCOUNTER — Encounter: Payer: PPO | Admitting: *Deleted

## 2023-10-15 DIAGNOSIS — Z951 Presence of aortocoronary bypass graft: Secondary | ICD-10-CM

## 2023-10-15 DIAGNOSIS — Z48812 Encounter for surgical aftercare following surgery on the circulatory system: Secondary | ICD-10-CM | POA: Diagnosis not present

## 2023-10-15 NOTE — Progress Notes (Signed)
 Daily Session Note  Patient Details  Name: Rebekah Gibson MRN: [id number] Date of Birth: 1945-12-17 Referring Provider:   Flowsheet Row Cardiac Rehab from 08/16/2023 in [facility] Cardiac and Pulmonary Rehab  Referring Provider Dr. Kelton Chester       Encounter Date: 10/15/2023  Check In:  Session Check In - 10/15/23 1121       Check-In   Supervising physician immediately available to respond to emergencies See telemetry face sheet for immediately available ER MD    Location [facility]-Cardiac & Pulmonary Rehab    Staff Present Othel Durand, RN, BSN, CCRP;Noah Tickle, BS, Exercise Physiologist;Joseph Bowman, [place]    Virtual Visit No    Medication changes reported     No    Warm-up and Cool-down Performed on first and last piece of equipment    Resistance Training Performed Yes    VAD Patient? No    PAD/SET Patient? No      Pain Assessment   Currently in Pain? No/denies                Social History   Tobacco Use  Smoking Status Never  Smokeless Tobacco Never    Goals Met:  Independence with exercise equipment Exercise tolerated well No report of concerns or symptoms today  Goals Unmet:  Not Applicable  Comments: Pt able to follow exercise prescription today without complaint.  Will continue to monitor for progression.    Dr. Oneil Pinal is Medical Director for [facility] Cardiac Rehabilitation.  Dr. Fuad Aleskerov is Medical Director for [facility] Pulmonary Rehabilitation.

## 2023-10-18 ENCOUNTER — Encounter: Payer: PPO | Admitting: *Deleted

## 2023-10-18 DIAGNOSIS — Z48812 Encounter for surgical aftercare following surgery on the circulatory system: Secondary | ICD-10-CM | POA: Diagnosis not present

## 2023-10-18 DIAGNOSIS — Z951 Presence of aortocoronary bypass graft: Secondary | ICD-10-CM

## 2023-10-18 NOTE — Progress Notes (Signed)
 Daily Session Note  Patient Details  Name: Rebekah Gibson MRN: [id number] Date of Birth: 1946/04/09 Referring Provider:   Flowsheet Row Cardiac Rehab from 08/16/2023 in [facility] Cardiac and Pulmonary Rehab  Referring Provider Dr. Kelton Chester       Encounter Date: 10/18/2023  Check In:  Session Check In - 10/18/23 1053       Check-In   Supervising physician immediately available to respond to emergencies See telemetry face sheet for immediately available ER MD    Location [facility]-Cardiac & Pulmonary Rehab    Staff Present Burnard Hint, BS, ACSM CEP, Exercise Physiologist;Maxon Conetta BS, , Exercise Physiologist;Margaret Best, MS, Exercise Physiologist;Other   Burnard Davenport, RN   Virtual Visit No    Medication changes reported     No    Fall or balance concerns reported    No    Warm-up and Cool-down Performed on first and last piece of equipment    Resistance Training Performed Yes    VAD Patient? No    PAD/SET Patient? No      Pain Assessment   Currently in Pain? No/denies                Social History   Tobacco Use  Smoking Status Never  Smokeless Tobacco Never    Goals Met:  Independence with exercise equipment Exercise tolerated well No report of concerns or symptoms today Strength training completed today  Goals Unmet:  Not Applicable  Comments: Pt able to follow exercise prescription today without complaint.  Will continue to monitor for progression.    Dr. Oneil Pinal is Medical Director for [facility] Cardiac Rehabilitation.  Dr. Fuad Aleskerov is Medical Director for [facility] Pulmonary Rehabilitation.

## 2023-10-20 ENCOUNTER — Encounter: Payer: Self-pay | Admitting: *Deleted

## 2023-10-20 ENCOUNTER — Encounter: Payer: PPO | Admitting: *Deleted

## 2023-10-20 DIAGNOSIS — Z48812 Encounter for surgical aftercare following surgery on the circulatory system: Secondary | ICD-10-CM | POA: Diagnosis not present

## 2023-10-20 DIAGNOSIS — Z951 Presence of aortocoronary bypass graft: Secondary | ICD-10-CM

## 2023-10-20 NOTE — Progress Notes (Signed)
 Daily Session Note  Patient Details  Name: Rebekah Gibson MRN: [id number] Date of Birth: 15-Sep-1946 Referring Provider:   Flowsheet Row Cardiac Rehab from 08/16/2023 in [facility] Cardiac and Pulmonary Rehab  Referring Provider Dr. Raeford Bullion       Encounter Date: 10/20/2023  Check In:  Session Check In - 10/20/23 1124       Check-In   Supervising physician immediately available to respond to emergencies See telemetry face sheet for immediately available ER MD    Location [facility]-Cardiac & Pulmonary Rehab    Staff Present Maud Sorenson, RN, BSN, CCRP;Meredith Manson Se[place] RN,BSN;Joseph Hood RCP,RRT,BSRT;Maxon [place] BS, Exercise Physiologist    Virtual Visit No    Medication changes reported     No    Fall or balance concerns reported    No    Warm-up and Cool-down Performed on first and last piece of equipment    Resistance Training Performed Yes    VAD Patient? No    PAD/SET Patient? No      Pain Assessment   Currently in Pain? No/denies                Social History   Tobacco Use  Smoking Status Never  Smokeless Tobacco Never    Goals Met:  Independence with exercise equipment Exercise tolerated well No report of concerns or symptoms today  Goals Unmet:  Not Applicable  Comments: Pt able to follow exercise prescription today without complaint.  Will continue to monitor for progression.    Dr. Firman Hughes is Medical Director for [facility] Cardiac Rehabilitation.  Dr. Fuad Aleskerov is Medical Director for [name] Pulmonary Rehabilitation.

## 2023-10-20 NOTE — Progress Notes (Signed)
 Cardiac Individual Treatment Plan  Patient Details  Name: Rebekah Gibson MRN: [id number] Date of Birth: 19-Jul-1946 Referring Provider:   Flowsheet Row Cardiac Rehab from 08/16/2023 in [facility] Cardiac and Pulmonary Rehab  Referring Provider Dr. Raeford Bullion       Initial Encounter Date:  Flowsheet Row Cardiac Rehab from 08/16/2023 in [facility] Cardiac and Pulmonary Rehab  Date 08/16/23       Visit Diagnosis: S/P CABG x 2  Patient's Home Medications on Admission:  Current Outpatient Medications:    aspirin  81 MG chewable tablet, Chew by mouth., Disp: , Rfl:    atorvastatin  (LIPITOR) 20 MG tablet, Take 1 tablet (20 mg total) by mouth at bedtime., Disp: , Rfl:    Cayenne 450 MG CAPS, Take 1 capsule by mouth as directed. (Patient not taking: Reported on 08/12/2023), Disp: , Rfl:    chlorhexidine (PERIDEX) 0.12 % solution, SMARTSIG:5-10 Milliliter(s) By Mouth Morning-Night (Patient not taking: Reported on 08/12/2023), Disp: , Rfl:    cholecalciferol (VITAMIN D3) 25 MCG (1000 UNIT) tablet, Take 1,000 Units by mouth daily., Disp: , Rfl:    Cinnamon 500 MG capsule, Take by mouth. (Patient not taking: Reported on 08/12/2023), Disp: , Rfl:    Cinnamon 500 MG TABS, Take 1 tablet by mouth as directed., Disp: , Rfl:    Cranberry 400 MG CAPS, Take 1 capsule by mouth as directed. (Patient not taking: Reported on 08/12/2023), Disp: , Rfl:    Cranberry 425 MG CAPS, Take by mouth., Disp: , Rfl:    diphenhydrAMINE  (BENADRYL ) 25 MG tablet, Take 25 mg by mouth every 6 (six) hours as needed (dizziness)., Disp: , Rfl:    empagliflozin (JARDIANCE) 10 MG TABS tablet, Take 10 mg by mouth daily., Disp: , Rfl:    ENTRESTO 49-51 MG, Take 1 tablet by mouth 2 (two) times daily., Disp: , Rfl:    Ginkgo Biloba 500 MG CAPS, Take 2,000 mg by mouth as directed. (Patient not taking: Reported on 08/12/2023), Disp: , Rfl:    heparin  25000 UT/250ML infusion, Inject 1,100 Units/hr into the vein continuous. (Patient not taking:  Reported on 08/12/2023), Disp: , Rfl:    insulin  aspart (NOVOLOG ) 100 UNIT/ML injection, Inject 0-15 Units into the skin 3 (three) times daily with meals. (Patient not taking: Reported on 08/12/2023), Disp: , Rfl:    Magnesium 500 MG TABS, Take 1 tablet by mouth as directed., Disp: , Rfl:    metoprolol succinate (TOPROL-XL) 50 MG 24 hr tablet, Take 50 mg by mouth daily., Disp: , Rfl:    Morphine  Sulfate (MORPHINE , PF,) 2 MG/ML injection, Inject 1 mL (2 mg total) into the vein every 3 (three) hours as needed. (Patient not taking: Reported on 08/12/2023), Disp: 1 mL, Rfl: 0   olmesartan -hydrochlorothiazide  (BENICAR  HCT) 40-12.5 MG tablet, Take 1 tablet by mouth daily. (Patient not taking: Reported on 08/12/2023), Disp: , Rfl:   Past Medical History: Past Medical History:  Diagnosis Date   EKG, abnormal    Female bladder prolapse    Frequency of urination    History of UTI    History of vertigo    Hypertension    Nocturia    Pre-diabetes    DIET CONTROLLED    Tobacco Use: Social History   Tobacco Use  Smoking Status Never  Smokeless Tobacco Never    Labs: Review Flowsheet        No data to display           Exercise Target Goals: Exercise Program Goal:  Individual exercise prescription set using results from initial 6 min walk test and THRR while considering  patient's activity barriers and safety.   Exercise Prescription Goal: Initial exercise prescription builds to 30-45 minutes a day of aerobic activity, 2-3 days per week.  Home exercise guidelines will be given to patient during program as part of exercise prescription that the participant will acknowledge.   Education: Aerobic Exercise: - Group verbal and visual presentation on the components of exercise prescription. Introduces F.I.T.T principle from ACSM for exercise prescriptions.  Reviews F.I.T.T. principles of aerobic exercise including progression. Written material given at graduation. Flowsheet Row Cardiac Rehab  from 10/20/2023 in [facility] Cardiac and Pulmonary Rehab  Education need identified 08/16/23  Date 10/13/23  Educator NT  Instruction Review Code 1- Verbalizes Understanding       Education: Resistance Exercise: - Group verbal and visual presentation on the components of exercise prescription. Introduces F.I.T.T principle from ACSM for exercise prescriptions  Reviews F.I.T.T. principles of resistance exercise including progression. Written material given at graduation. Flowsheet Row Cardiac Rehab from 10/20/2023 in [facility] Cardiac and Pulmonary Rehab  Date 10/04/23  Educator NT  Instruction Review Code 1- Verbalizes Understanding        Education: Exercise & Equipment Safety: - Individual verbal instruction and demonstration of equipment use and safety with use of the equipment. Flowsheet Row Cardiac Rehab from 10/20/2023 in [facility] Cardiac and Pulmonary Rehab  Date 08/16/23  Educator [facility]  Instruction Review Code 1- Verbalizes Understanding       Education: Exercise Physiology & General Exercise Guidelines: - Group verbal and written instruction with models to review the exercise physiology of the cardiovascular system and associated critical values. Provides general exercise guidelines with specific guidelines to those with heart or lung disease.  Flowsheet Row Cardiac Rehab from 10/20/2023 in [facility] Cardiac and Pulmonary Rehab  Date 09/27/23  Educator [facility]  Instruction Review Code 1- [employer] Understanding       Education: Flexibility, Balance, Mind/Body Relaxation: - Group verbal and visual presentation with interactive activity on the components of exercise prescription. Introduces F.I.T.T principle from ACSM for exercise prescriptions. Reviews F.I.T.T. principles of flexibility and balance exercise training including progression. Also discusses the mind body connection.  Reviews various relaxation techniques to help reduce and manage stress (i.e. Deep breathing, progressive muscle  relaxation, and visualization). Balance handout provided to take home. Written material given at graduation. Flowsheet Row Cardiac Rehab from 10/20/2023 in [facility] Cardiac and Pulmonary Rehab  Date 10/04/23  Educator NT  Instruction Review Code 1- Verbalizes Understanding       Activity Barriers & Risk Stratification:  Activity Barriers & Cardiac Risk Stratification - 08/16/23 1527       Activity Barriers & Cardiac Risk Stratification   Activity Barriers Other (comment)    Comments spells of verdigo    Cardiac Risk Stratification High             6 Minute Walk:  6 Minute Walk     Row Name 08/16/23 1526         6 Minute Walk   Phase Initial     Distance 1150 feet     Walk Time 6 minutes     # of Rest Breaks 0     MPH 2.18     METS 2.12     RPE 15     Perceived Dyspnea  1     VO2 Peak 7.42     Symptoms No     Resting HR  71 bpm     Resting BP 138/70     Resting Oxygen Saturation  95 %     Exercise Oxygen Saturation  during 6 min walk 93 %     Max Ex. HR 99 bpm     Max Ex. BP 142/82     2 Minute Post BP 132/80              Oxygen Initial Assessment:   Oxygen Re-Evaluation:   Oxygen Discharge (Final Oxygen Re-Evaluation):   Initial Exercise Prescription:  Initial Exercise Prescription - 08/16/23 1500       Date of Initial Exercise RX and Referring Provider   Date 08/16/23    Referring Provider Dr. Raeford Bullion      Oxygen   Maintain Oxygen Saturation 88% or higher      Treadmill   MPH 1.5    Grade 0    Minutes 15    METs 2.15      Recumbant Bike   Level 2    RPM 50    Watts 15    Minutes 15    METs 2.12      REL-XR   Level 2    Speed 50    Minutes 15    METs 2.12      T5 Nustep   Level 2    SPM 80    Minutes 15    METs 2.12      Biostep-RELP   Level 2    SPM 50    Minutes 15    METs 2.12      Track   Laps 20    Minutes 15    METs 2.09      Prescription Details   Frequency (times per week) 3    Duration  Progress to 30 minutes of continuous aerobic without signs/symptoms of physical distress      Intensity   THRR 40-80% of Max Heartrate 100-129    Ratings of Perceived Exertion 11-13    Perceived Dyspnea 0-4      Progression   Progression Continue to progress workloads to maintain intensity without signs/symptoms of physical distress.      Resistance Training   Training Prescription Yes    Weight 3    Reps 10-15             Perform Capillary Blood Glucose checks as needed.  Exercise Prescription Changes:   Exercise Prescription Changes     Row Name 08/16/23 1500 08/31/23 1400 09/16/23 1700 09/28/23 1200 10/04/23 1100     Response to Exercise   Blood Pressure (Admit) 138/70 132/70 122/68 122/64 --   Blood Pressure (Exercise) 142/82 122/70 138/68 130/58 --   Blood Pressure (Exit) 132/80 110/58 118/64 112/64 --   Heart Rate (Admit) 71 bpm 97 bpm 75 bpm 87 bpm --   Heart Rate (Exercise) 99 bpm 97 bpm 110 bpm 105 bpm --   Heart Rate (Exit) 81 bpm 82 bpm 80 bpm 84 bpm --   Oxygen Saturation (Admit) 95 % -- -- -- --   Oxygen Saturation (Exercise) 93 % -- -- -- --   Oxygen Saturation (Exit) 95 % -- -- -- --   Rating of Perceived Exertion (Exercise) 15 13 15 13  --   Perceived Dyspnea (Exercise) 1 -- 0 -- --   Symptoms none -- none none --   Comments 6 MWT results -- -- -- --   Duration -- Continue with 30 min of aerobic exercise without  signs/symptoms of physical distress. Continue with 30 min of aerobic exercise without signs/symptoms of physical distress. Continue with 30 min of aerobic exercise without signs/symptoms of physical distress. --   Intensity -- THRR unchanged THRR unchanged THRR unchanged --     Progression   Progression -- Continue to progress workloads to maintain intensity without signs/symptoms of physical distress. Continue to progress workloads to maintain intensity without signs/symptoms of physical distress. Continue to progress workloads to maintain  intensity without signs/symptoms of physical distress. --   Average METs -- 2 2.2 2.58 --     Resistance Training   Training Prescription -- Yes Yes Yes --   Weight -- 3 3 3  lb --   Reps -- 10-15 10-15 10-15 --     Interval Training   Interval Training -- No No No --     Treadmill   MPH -- -- 1.8 1.7 --   Grade -- -- 0 0 --   Minutes -- -- 15 15 --   METs -- -- 2.38 2.3 --     Recumbant Bike   Level -- -- 2 1 --   Watts -- -- 15 13 --   Minutes -- -- 15 15 --   METs -- -- 2.67 2.59 --     REL-XR   Level -- -- 2 2 --   Minutes -- -- 15 15 --   METs -- -- 3.1 4.6 --     T5 Nustep   Level -- 2 3 1  --   SPM -- 80 -- -- --   Minutes -- 15 15 15  --   METs -- 2 1.9 1.9 --     Biostep-RELP   Level -- 1 1 1  --   SPM -- 50 -- -- --   Minutes -- 15 15 15  --   METs -- 2 2 2  --     Track   Laps -- -- -- 30 --   Minutes -- -- -- 15 --   METs -- -- -- 2.63 --     Home Exercise Plan   Plans to continue exercise at -- -- -- -- Home (comment)  Walking on treadmill and outside, 3lb hand weights   Frequency -- -- -- -- Add 2 additional days to program exercise sessions.   Initial Home Exercises Provided -- -- -- -- 10/04/23     Oxygen   Maintain Oxygen Saturation -- -- 88% or higher 88% or higher 88% or higher    Row Name 10/13/23 0800             Response to Exercise   Blood Pressure (Admit) 126/60       Blood Pressure (Exit) 114/64       Heart Rate (Admit) 86 bpm       Heart Rate (Exercise) 107 bpm       Heart Rate (Exit) 91 bpm       Rating of Perceived Exertion (Exercise) 13       Symptoms none       Duration Continue with 30 min of aerobic exercise without signs/symptoms of physical distress.       Intensity THRR unchanged         Progression   Progression Continue to progress workloads to maintain intensity without signs/symptoms of physical distress.       Average METs 2.9         Resistance Training   Training Prescription Yes  Weight 3 lb        Reps 10-15         Interval Training   Interval Training No         Treadmill   MPH 1.9       Grade 0       Minutes 15       METs 2.45         REL-XR   Level 2       Minutes 15       METs 4.7         Biostep-RELP   Level 2       Minutes 15       METs 2         Track   Laps 30       Minutes 15       METs 2.63         Home Exercise Plan   Plans to continue exercise at Home (comment)  Walking on treadmill and outside, 3lb hand weights       Frequency Add 2 additional days to program exercise sessions.       Initial Home Exercises Provided 10/04/23         Oxygen   Maintain Oxygen Saturation 88% or higher                Exercise Comments:   Exercise Comments     Row Name 08/25/23 1036           Exercise Comments First full day of exercise!  Patient was oriented to gym and equipment including functions, settings, policies, and procedures.  Patient's individual exercise prescription and treatment plan were reviewed.  All starting workloads were established based on the results of the 6 minute walk test done at initial orientation visit.  The plan for exercise progression was also introduced and progression will be customized based on patient's performance and goals.                Exercise Goals and Review:   Exercise Goals     Row Name 08/16/23 1536             Exercise Goals   Increase Physical Activity Yes       Intervention Provide advice, education, support and counseling about physical activity/exercise needs.;Develop an individualized exercise prescription for aerobic and resistive training based on initial evaluation findings, risk stratification, comorbidities and participant's personal goals.       Expected Outcomes Short Term: Attend rehab on a regular basis to increase amount of physical activity.;Long Term: Add in home exercise to make exercise part of routine and to increase amount of physical activity.;Long Term: Exercising regularly at  least 3-5 days a week.       Increase Strength and Stamina Yes       Intervention Provide advice, education, support and counseling about physical activity/exercise needs.;Develop an individualized exercise prescription for aerobic and resistive training based on initial evaluation findings, risk stratification, comorbidities and participant's personal goals.       Expected Outcomes Short Term: Increase workloads from initial exercise prescription for resistance, speed, and METs.;Short Term: Perform resistance training exercises routinely during rehab and add in resistance training at home;Long Term: Improve cardiorespiratory fitness, muscular endurance and strength as measured by increased METs and functional capacity ( )       Able to understand and use rate of perceived exertion (RPE) scale Yes       Intervention  Provide education and explanation on how to use RPE scale       Expected Outcomes Short Term: Able to use RPE daily in rehab to express subjective intensity level;Long Term:  Able to use RPE to guide intensity level when exercising independently       Able to understand and use Dyspnea scale Yes       Intervention Provide education and explanation on how to use Dyspnea scale       Expected Outcomes Short Term: Able to use Dyspnea scale daily in rehab to express subjective sense of shortness of breath during exertion;Long Term: Able to use Dyspnea scale to guide intensity level when exercising independently       Knowledge and understanding of Target Heart Rate Range (THRR) Yes       Intervention Provide education and explanation of THRR including how the numbers were predicted and where they are located for reference       Expected Outcomes Short Term: Able to state/look up THRR;Long Term: Able to use THRR to govern intensity when exercising independently;Short Term: Able to use daily as guideline for intensity in rehab       Able to check pulse independently Yes       Intervention  Provide education and demonstration on how to check pulse in carotid and radial arteries.;Review the importance of being able to check your own pulse for safety during independent exercise       Expected Outcomes Short Term: Able to explain why pulse checking is important during independent exercise;Long Term: Able to check pulse independently and accurately       Understanding of Exercise Prescription Yes       Intervention Provide education, explanation, and written materials on patient's individual exercise prescription       Expected Outcomes Short Term: Able to explain program exercise prescription;Long Term: Able to explain home exercise prescription to exercise independently                Exercise Goals Re-Evaluation :  Exercise Goals Re-Evaluation     Row Name 08/25/23 1036 08/31/23 1444 09/16/23 1709 09/28/23 1214 10/04/23 1142     Exercise Goal Re-Evaluation   Exercise Goals Review Able to understand and use rate of perceived exertion (RPE) scale;Able to understand and use Dyspnea scale;Knowledge and understanding of Target Heart Rate Range (THRR);Understanding of Exercise Prescription Increase Physical Activity;Understanding of Exercise Prescription;Increase Strength and Stamina Increase Physical Activity;Understanding of Exercise Prescription;Increase Strength and Stamina Increase Physical Activity;Understanding of Exercise Prescription;Increase Strength and Stamina Increase Physical Activity;Understanding of Exercise Prescription;Increase Strength and Stamina;Knowledge and understanding of Target Heart Rate Range (THRR);Able to understand and use rate of perceived exertion (RPE) scale;Able to understand and use Dyspnea scale;Able to check pulse independently   Comments Reviewed RPE and dyspnea scale, THR and program prescription with pt today.  Pt voiced understanding and was given a copy of goals to take home. Emyle has completed her first session. She tolerated her current exercise  prescription well with level 1 on the biostep, level 2 on the T5 nustep, and 3 lb weights. We will continue to monitor her progress in the program. Emilee has been doing well in rehab. She was able to increase her level on the T5 nustep from level 2 to 3. She was also able to increase her speed on the treadmil from 1.5 mph to 1.66mph. We will continue to monitor her progress in the program. Elianna is doing well in rehab. She recently increased her  number of laps walked on the track up to 30 laps. She also has worked at level 1 on the biostep, recumbent bike, and T5 nustep, and level 2 on the XR. We will continue to monitor her progress in the program. Reviewed home exercise with pt today.  Pt plans to walk on her treadmill, walk outside, and use her personal 3 lb hand weights for exercise.  Reviewed THR, pulse, RPE, sign and symptoms, pulse oximetery and when to call 911 or MD.  Also discussed weather considerations and indoor options.  Pt voiced understanding.   Expected Outcomes Short: Use RPE daily to regulate intensity. Long: Follow program prescription in THR. Short: Continue to follow current exercise prescription. Long: Continue exercise to improve strength and stamina. Short: Continue to follow current exercise prescription. Long: Continue exercise to improve strength and stamina. Short: Progressively increase workloads on seated machines. Long: Continue exercise to improve strength and stamina. Short: Begin walking at home on days away from rehab. Long: Continue exercise to improve strength and stamina.    Row Name 10/13/23 0811             Exercise Goal Re-Evaluation   Exercise Goals Review Increase Physical Activity;Increase Strength and Stamina;Understanding of Exercise Prescription       Comments Zorana is doing well in rehab. She has been able to increase her speed on the treadmill to 1. , she was also able to increase her level on the biostep to level 2. We will continue to monitor her  progress in the program.       Expected Outcomes Short: Continue to increase treadmill workloads. Long: Continue exercise to improve strength and stamina.                Discharge Exercise Prescription (Final Exercise Prescription Changes):  Exercise Prescription Changes - 10/13/23 0800       Response to Exercise   Blood Pressure (Admit) 126/60    Blood Pressure (Exit) 114/64    Heart Rate (Admit) 86 bpm    Heart Rate (Exercise) 107 bpm    Heart Rate (Exit) 91 bpm    Rating of Perceived Exertion (Exercise) 13    Symptoms none    Duration Continue with 30 min of aerobic exercise without signs/symptoms of physical distress.    Intensity THRR unchanged      Progression   Progression Continue to progress workloads to maintain intensity without signs/symptoms of physical distress.    Average METs 2.9      Resistance Training   Training Prescription Yes    Weight 3 lb    Reps 10-15      Interval Training   Interval Training No      Treadmill   MPH 1.9    Grade 0    Minutes 15    METs 2.45      REL-XR   Level 2    Minutes 15    METs 4.7      Biostep-RELP   Level 2    Minutes 15    METs 2      Track   Laps 30    Minutes 15    METs 2.63      Home Exercise Plan   Plans to continue exercise at Home (comment)   Walking on treadmill and outside, 3lb hand weights   Frequency Add 2 additional days to program exercise sessions.    Initial Home Exercises Provided 10/04/23      Oxygen   Maintain Oxygen  Saturation 88% or higher             Nutrition:  Target Goals: Understanding of nutrition guidelines, daily intake of sodium 1500mg , cholesterol 200mg , calories 30% from fat and 7% or less from saturated fats, daily to have 5 or more servings of fruits and vegetables.  Education: All About Nutrition: -Group instruction provided by verbal, written material, interactive activities, discussions, models, and posters to present general guidelines for heart healthy  nutrition including fat, fiber, MyPlate, the role of sodium in heart healthy nutrition, utilization of the nutrition label, and utilization of this knowledge for meal planning. Follow up email sent as well. Written material given at graduation. Flowsheet Row Cardiac Rehab from 10/20/2023 in [facility] Cardiac and Pulmonary Rehab  Date 10/20/23  Educator JG  Instruction Review Code 1- Verbalizes Understanding       Biometrics:  Pre Biometrics - 08/16/23 1542       Pre Biometrics   Height 5' (1.524 m)    Weight 154 lb (69.9 kg)    Waist Circumference 40 inches    Hip Circumference 44.5 inches    Waist to Hip Ratio 0.9 %    BMI (Calculated) 30.08    Single Leg Stand 4.66 seconds              Nutrition Therapy Plan and Nutrition Goals:   Nutrition Assessments:  MEDIFICTS Score Key: >=70 Need to make dietary changes  40-70 Heart Healthy Diet <= 40 Therapeutic Level Cholesterol Diet  Flowsheet Row Cardiac Rehab from 08/16/2023 in [facility] Cardiac and Pulmonary Rehab  Picture Your Plate Total Score on Admission 75      Picture Your Plate Scores: <16 Unhealthy dietary pattern with much room for improvement. 41-50 Dietary pattern unlikely to meet recommendations for good health and room for improvement. 51-60 More healthful dietary pattern, with some room for improvement.  >60 Healthy dietary pattern, although there may be some specific behaviors that could be improved.    Nutrition Goals Re-Evaluation:  Nutrition Goals Re-Evaluation     Row Name 10/04/23 1128             Goals   Comment Alainna states that she is still working on dietary patterns she discussed with the RD. She states having too much sugar over christmas, but is doing overall limiting unhealthy foods. She states that she is continuing to try and limit sodium intake. She reports that she is not drinking enough water  but is trying to drink more.       Expected Outcome Short: Continue to work on drinking plenty  of water . Long: Continue to practice heart healthy eating patterns.                Nutrition Goals Discharge (Final Nutrition Goals Re-Evaluation):  Nutrition Goals Re-Evaluation - 10/04/23 1128       Goals   Comment Caryss states that she is still working on dietary patterns she discussed with the RD. She states having too much sugar over christmas, but is doing overall limiting unhealthy foods. She states that she is continuing to try and limit sodium intake. She reports that she is not drinking enough water  but is trying to drink more.    Expected Outcome Short: Continue to work on drinking plenty of water . Long: Continue to practice heart healthy eating patterns.             Psychosocial: Target Goals: Acknowledge presence or absence of significant depression and/or stress, maximize coping skills, provide  positive support system. Participant is able to verbalize types and ability to use techniques and skills needed for reducing stress and depression.   Education: Stress, Anxiety, and Depression - Group verbal and visual presentation to define topics covered.  Reviews how body is impacted by stress, anxiety, and depression.  Also discusses healthy ways to reduce stress and to treat/manage anxiety and depression.  Written material given at graduation. Flowsheet Row Cardiac Rehab from 10/20/2023 in [facility] Cardiac and Pulmonary Rehab  Date 09/22/23  Educator SB  Instruction Review Code 1- [employer] Understanding       Education: Sleep Hygiene -Provides group verbal and written instruction about how sleep can affect your health.  Define sleep hygiene, discuss sleep cycles and impact of sleep habits. Review good sleep hygiene tips.    Initial Review & Psychosocial Screening:  Initial Psych Review & Screening - 08/12/23 1017       Initial Review   Current issues with None Identified      Family Dynamics   Good Support System? Yes    Comments Ellec has a good family support  system and can look to her five children for support. She does not take any medications for her mood.      Barriers   Psychosocial barriers to participate in program The patient should benefit from training in stress management and relaxation.;There are no identifiable barriers or psychosocial needs.      Screening Interventions   Interventions To provide support and resources with identified psychosocial needs;Encouraged to exercise;Provide feedback about the scores to participant    Expected Outcomes Short Term goal: Utilizing psychosocial counselor, staff and physician to assist with identification of specific Stressors or current issues interfering with healing process. Setting desired goal for each stressor or current issue identified.;Long Term Goal: Stressors or current issues are controlled or eliminated.;Short Term goal: Identification and review with participant of any Quality of Life or Depression concerns found by scoring the questionnaire.;Long Term goal: The participant improves quality of Life and PHQ9 Scores as seen by post scores and/or verbalization of changes             Quality of Life Scores:   Quality of Life - 08/16/23 1545       Quality of Life   Select Quality of Life      Quality of Life Scores   Health/Function Pre 23.9 %    Socioeconomic Pre 22.5 %    Psych/Spiritual Pre 24.29 %    Family Pre 25.8 %    GLOBAL Pre 23.93 %            Scores of 19 and below usually indicate a poorer quality of life in these areas.  A difference of  2-3 points is a clinically meaningful difference.  A difference of 2-3 points in the total score of the Quality of Life Index has been associated with significant improvement in overall quality of life, self-image, physical symptoms, and general health in studies assessing change in quality of life.  PHQ-9: Review Flowsheet       08/16/2023  Depression screen PHQ 2/9  Decreased Interest 0  Down, Depressed, Hopeless 0   PHQ - 2 Score 0  Altered sleeping 1  Tired, decreased energy 1  Change in appetite 0  Feeling bad or failure about yourself  0  Trouble concentrating 0  Moving slowly or fidgety/restless 0  Suicidal thoughts 0  PHQ-9 Score 2  Difficult doing work/chores Not difficult at all  Interpretation of Total Score  Total Score Depression Severity:  1-4 = Minimal depression, 5-9 = Mild depression, 10-14 = Moderate depression, 15-19 = Moderately severe depression, 20-27 = Severe depression   Psychosocial Evaluation and Intervention:  Psychosocial Evaluation - 08/12/23 1018       Psychosocial Evaluation & Interventions   Interventions Relaxation education;Stress management education;Encouraged to exercise with the program and follow exercise prescription    Comments Ellec has a good family support system and can look to her five children for support. She does not take any medications for her mood.    Expected Outcomes Short: Start [facility] to help with mood. Long: Maintain a healthy mental state    Continue Psychosocial Services  Follow up required by staff             Psychosocial Re-Evaluation:  Psychosocial Re-Evaluation     Row Name 10/04/23 1124             Psychosocial Re-Evaluation   Current issues with Current Stress Concerns       Comments Adamaris states that she is dealing with some vertigo which has caused some stress for her. She states that she is doing well overall, except for her children not allowing her to drive. She states that she is sleeping well at this time. She states that her five children are a good support system for her and they help her cook and clean. She also enjoys reading, puzzles and crocheting for stress relief.       Expected Outcomes Short: Continue to attend cardiac rehab for stress relief. Long: Continue to maintain positive outlook.       Interventions Encouraged to attend Cardiac Rehabilitation for the exercise       Continue Psychosocial  Services  Follow up required by staff                Psychosocial Discharge (Final Psychosocial Re-Evaluation):  Psychosocial Re-Evaluation - 10/04/23 1124       Psychosocial Re-Evaluation   Current issues with Current Stress Concerns    Comments Tomisha states that she is dealing with some vertigo which has caused some stress for her. She states that she is doing well overall, except for her children not allowing her to drive. She states that she is sleeping well at this time. She states that her five children are a good support system for her and they help her cook and clean. She also enjoys reading, puzzles and crocheting for stress relief.    Expected Outcomes Short: Continue to attend cardiac rehab for stress relief. Long: Continue to maintain positive outlook.    Interventions Encouraged to attend Cardiac Rehabilitation for the exercise    Continue Psychosocial Services  Follow up required by staff             Vocational Rehabilitation: Provide vocational rehab assistance to qualifying candidates.   Vocational Rehab Evaluation & Intervention:   Education: Education Goals: Education classes will be provided on a variety of topics geared toward better understanding of heart health and risk factor modification. Participant will state understanding/return demonstration of topics presented as noted by education test scores.  Learning Barriers/Preferences:  Learning Barriers/Preferences - 08/12/23 1016       Learning Barriers/Preferences   Learning Barriers None    Learning Preferences None             General Cardiac Education Topics:  AED/CPR: - Group verbal and written instruction with the use of models to demonstrate the  basic use of the AED with the basic ABC's of resuscitation.   Anatomy and Cardiac Procedures: - Group verbal and visual presentation and models provide information about basic cardiac anatomy and function. Reviews the testing methods done to  diagnose heart disease and the outcomes of the test results. Describes the treatment choices: Medical Management, Angioplasty, or Coronary Bypass Surgery for treating various heart conditions including Myocardial Infarction, Angina, Valve Disease, and Cardiac Arrhythmias.  Written material given at graduation. Flowsheet Row Cardiac Rehab from 10/20/2023 in [facility] Cardiac and Pulmonary Rehab  Education need identified 08/16/23  Date 08/25/23  Educator SB  Instruction Review Code 1- Verbalizes Understanding       Medication Safety: - Group verbal and visual instruction to review commonly prescribed medications for heart and lung disease. Reviews the medication, class of the drug, and side effects. Includes the steps to properly store meds and maintain the prescription regimen.  Written material given at graduation. Flowsheet Row Cardiac Rehab from 10/20/2023 in [facility] Cardiac and Pulmonary Rehab  Date 09/01/23  Educator SB  Instruction Review Code 1- Verbalizes Understanding       Intimacy: - Group verbal instruction through game format to discuss how heart and lung disease can affect sexual intimacy. Written material given at graduation.. Flowsheet Row Cardiac Rehab from 10/20/2023 in [facility] Cardiac and Pulmonary Rehab  Date 10/13/23  Educator NT  Instruction Review Code 1- Verbalizes Understanding       Know Your Numbers and Heart Failure: - Group verbal and visual instruction to discuss disease risk factors for cardiac and pulmonary disease and treatment options.  Reviews associated critical values for Overweight/Obesity, Hypertension, Cholesterol, and Diabetes.  Discusses basics of heart failure: signs/symptoms and treatments.  Introduces Heart Failure Zone chart for action plan for heart failure.  Written material given at graduation. Flowsheet Row Cardiac Rehab from 10/20/2023 in [facility] Cardiac and Pulmonary Rehab  Date 09/08/23  Educator SB  Instruction Review Code 1- Verbalizes  Understanding       Infection Prevention: - Provides verbal and written material to individual with discussion of infection control including proper hand washing and proper equipment cleaning during exercise session. Flowsheet Row Cardiac Rehab from 10/20/2023 in [facility] Cardiac and Pulmonary Rehab  Date 08/16/23  Educator [facility]  Instruction Review Code 1- Verbalizes Understanding       Falls Prevention: - Provides verbal and written material to individual with discussion of falls prevention and safety. Flowsheet Row Cardiac Rehab from 10/20/2023 in [facility] Cardiac and Pulmonary Rehab  Date 08/16/23  Educator [facility]  Instruction Review Code 1- Verbalizes Understanding       Other: -Provides group and verbal instruction on various topics (see comments)   Knowledge Questionnaire Score:  Knowledge Questionnaire Score - 08/16/23 1544       Knowledge Questionnaire Score   Pre Score 24/26             Core Components/Risk Factors/Patient Goals at Admission:  Personal Goals and Risk Factors at Admission - 08/16/23 1546       Core Components/Risk Factors/Patient Goals on Admission    Weight Management Yes;Weight Loss    Intervention Weight Management: Develop a combined nutrition and exercise program designed to reach desired caloric intake, while maintaining appropriate intake of nutrient and fiber, sodium and fats, and appropriate energy expenditure required for the weight goal.;Weight Management: Provide education and appropriate resources to help participant work on and attain dietary goals.;Weight Management/Obesity: Establish reasonable short term and long term weight goals.  Admit Weight 154 lb (69.9 kg)    Goal Weight: Short Term 148 lb (67.1 kg)    Goal Weight: Long Term 140 lb (63.5 kg)    Expected Outcomes Short Term: Continue to assess and modify interventions until short term weight is achieved;Long Term: Adherence to nutrition and physical activity/exercise program aimed  toward attainment of established weight goal;Weight Loss: Understanding of general recommendations for a balanced deficit meal plan, which promotes 1-2 lb weight loss per week and includes a negative energy balance of [phone] kcal/d;Understanding recommendations for meals to include 15-35% energy as protein, 25-35% energy from fat, 35-60% energy from carbohydrates, less than 200mg  of dietary cholesterol, 20-35 gm of total fiber daily;Understanding of distribution of calorie intake throughout the day with the consumption of 4-5 meals/snacks    Diabetes Yes    Intervention Provide education about signs/symptoms and action to take for hypo/hyperglycemia.;Provide education about proper nutrition, including hydration, and aerobic/resistive exercise prescription along with prescribed medications to achieve blood glucose in normal ranges: Fasting glucose 65-99 mg/dL    Expected Outcomes Short Term: Participant verbalizes understanding of the signs/symptoms and immediate care of hyper/hypoglycemia, proper foot care and importance of medication, aerobic/resistive exercise and nutrition plan for blood glucose control.;Long Term: Attainment of HbA1C < 7%.    Heart Failure Yes    Intervention Provide a combined exercise and nutrition program that is supplemented with education, support and counseling about heart failure. Directed toward relieving symptoms such as shortness of breath, decreased exercise tolerance, and extremity edema.    Expected Outcomes Improve functional capacity of life;Short term: Attendance in program 2-3 days a week with increased exercise capacity. Reported lower sodium intake. Reported increased fruit and vegetable intake. Reports medication compliance.;Short term: Daily weights obtained and reported for increase. Utilizing diuretic protocols set by physician.;Long term: Adoption of self-care skills and reduction of barriers for early signs and symptoms recognition and intervention leading to  self-care maintenance.    Hypertension Yes    Intervention Provide education on lifestyle modifcations including regular physical activity/exercise, weight management, moderate sodium restriction and increased consumption of fresh fruit, vegetables, and low fat dairy, alcohol moderation, and smoking cessation.;Monitor prescription use compliance.    Expected Outcomes Short Term: Continued assessment and intervention until BP is < 140/46mm HG in hypertensive participants. < 130/82mm HG in hypertensive participants with diabetes, heart failure or chronic kidney disease.;Long Term: Maintenance of blood pressure at goal levels.    Lipids Yes    Intervention Provide education and support for participant on nutrition & aerobic/resistive exercise along with prescribed medications to achieve LDL 70mg , HDL >40mg .    Expected Outcomes Short Term: Participant states understanding of desired cholesterol values and is compliant with medications prescribed. Participant is following exercise prescription and nutrition guidelines.;Long Term: Cholesterol controlled with medications as prescribed, with individualized exercise RX and with personalized nutrition plan. Value goals: LDL < 70mg , HDL > 40 mg.             Education:Diabetes - Individual verbal and written instruction to review signs/symptoms of diabetes, desired ranges of glucose level fasting, after meals and with exercise. Acknowledge that pre and post exercise glucose checks will be done for 3 sessions at entry of program. Flowsheet Row Cardiac Rehab from 10/20/2023 in [facility] Cardiac and Pulmonary Rehab  Date 08/16/23  Educator [facility]  Instruction Review Code 1- Verbalizes Understanding       Core Components/Risk Factors/Patient Goals Review:   Goals and Risk Factor Review     Row  Name 10/04/23 1130             Core Components/Risk Factors/Patient Goals Review   Personal Goals Review Diabetes;Hypertension;Weight Management/Obesity        Review Takema states that her weight has been creeping up some. She wants to work on losing some weight as she weighed in today at 156 lb and has a weight goal of 120 lbs.She is checking her BP at home every morning, and reports that it has stayed within normal ranges. Her sugar has been well controlled as well, as she checks it every morning also.       Expected Outcomes Short: Continue to work towards weight goal through diet and exercise. Long: Continue to manage lifestyle risk factors.                Core Components/Risk Factors/Patient Goals at Discharge (Final Review):   Goals and Risk Factor Review - 10/04/23 1130       Core Components/Risk Factors/Patient Goals Review   Personal Goals Review Diabetes;Hypertension;Weight Management/Obesity    Review Amalie states that her weight has been creeping up some. She wants to work on losing some weight as she weighed in today at 156 lb and has a weight goal of 120 lbs.She is checking her BP at home every morning, and reports that it has stayed within normal ranges. Her sugar has been well controlled as well, as she checks it every morning also.    Expected Outcomes Short: Continue to work towards weight goal through diet and exercise. Long: Continue to manage lifestyle risk factors.             ITP Comments:  ITP Comments     Row Name 08/12/23 1015 08/16/23 1525 08/25/23 1035 08/25/23 1124 09/22/23 0940   ITP Comments Virtual Visit completed. Patient informed on EP and RD appointment and 6 Minute walk test. Patient also informed of patient health questionnaires on My Chart. Patient Verbalizes understanding. Visit diagnosis can be found in [facility] 07/06/2023. Completed and gym orientation. Initial ITP created and sent for review to Dr. Firman Hughes, Medical Director. First full day of exercise!  Patient was oriented to gym and equipment including functions, settings, policies, and procedures.  Patient's individual exercise prescription and  treatment plan were reviewed.  All starting workloads were established based on the results of the 6 minute walk test done at initial orientation visit.  The plan for exercise progression was also introduced and progression will be customized based on patient's performance and goals. 30 Day review completed. Medical Director ITP review done, changes made as directed, and signed approval by Medical Director.    new to program 30 Day review completed. Medical Director ITP review done, changes made as directed, and signed approval by Medical Director.    Row Name 10/20/23 1247           ITP Comments 30 Day review completed. Medical Director ITP review done, changes made as directed, and signed approval by Medical Director.                Comments:

## 2023-10-22 ENCOUNTER — Encounter: Payer: PPO | Admitting: *Deleted

## 2023-10-22 DIAGNOSIS — Z951 Presence of aortocoronary bypass graft: Secondary | ICD-10-CM

## 2023-10-22 DIAGNOSIS — Z48812 Encounter for surgical aftercare following surgery on the circulatory system: Secondary | ICD-10-CM | POA: Diagnosis not present

## 2023-10-22 NOTE — Progress Notes (Signed)
Daily Session Note  Patient Details  Name: Rebekah Gibson MRN: [id number] Date of Birth: January 06, 1946 Referring Provider:   Flowsheet Row Cardiac Rehab from 08/16/2023 in [facility] Cardiac and Pulmonary Rehab  Referring Provider Dr. Trixie Rude       Encounter Date: 10/22/2023  Check In:  Session Check In - 10/22/23 1140       Check-In   Supervising physician immediately available to respond to emergencies See telemetry face sheet for immediately available ER MD    Location [facility]-Cardiac & Pulmonary Rehab    Staff Present Bess Kinds RN,BSN;[facility] [name], [name], Exercise Physiologist    Virtual Visit No    Medication changes reported     No    Fall or balance concerns reported    No    Warm-up and Cool-down Performed on first and last piece of equipment    Resistance Training Performed Yes    VAD Patient? No    PAD/SET Patient? No      Pain Assessment   Currently in Pain? No/denies                Social History   Tobacco Use  Smoking Status Never  Smokeless Tobacco Never    Goals Met:  Independence with exercise equipment Exercise tolerated well No report of concerns or symptoms today Strength training completed today  Goals Unmet:  Not Applicable  Comments: Pt able to follow exercise prescription today without complaint.  Will continue to monitor for progression.    Dr. Bethann Punches is Medical Director for [facility] Cardiac Rehabilitation.  Dr. Vida Rigger is Medical Director for [facility] Pulmonary Rehabilitation.

## 2023-10-25 ENCOUNTER — Encounter: Payer: PPO | Admitting: *Deleted

## 2023-10-25 VITALS — Ht 60.0 in | Wt 156.2 lb

## 2023-10-25 DIAGNOSIS — Z951 Presence of aortocoronary bypass graft: Secondary | ICD-10-CM

## 2023-10-25 DIAGNOSIS — Z48812 Encounter for surgical aftercare following surgery on the circulatory system: Secondary | ICD-10-CM | POA: Diagnosis not present

## 2023-10-25 NOTE — Progress Notes (Signed)
Daily Session Note  Patient Details  Name: Rebekah Gibson MRN: [id number] Date of Birth: 09/07/1946 Referring Provider:   Flowsheet Row Cardiac Rehab from 08/16/2023 in [facility] Cardiac and Pulmonary Rehab  Referring Provider Dr. Trixie Rude       Encounter Date: 10/25/2023  Check In:  Session Check In - 10/25/23 1110       Check-In   Supervising physician immediately available to respond to emergencies See telemetry face sheet for immediately available ER MD    Staff Present Rory Percy, MS, Exercise Physiologist;Maxon Suzzette Righter, Exercise Physiologist;Kelly Cloretta Ned, ACSM CEP, Exercise Physiologist;Barnard Sharps Jewel Baize RN,BSN    Virtual Visit No    Medication changes reported     No    Fall or balance concerns reported    No    Warm-up and Cool-down Performed on first and last piece of equipment    Resistance Training Performed Yes    VAD Patient? No    PAD/SET Patient? No      Pain Assessment   Currently in Pain? No/denies                Social History   Tobacco Use  Smoking Status Never  Smokeless Tobacco Never    Goals Met:  Independence with exercise equipment Exercise tolerated well No report of concerns or symptoms today Strength training completed today  Goals Unmet:  Not Applicable  Comments: Pt able to follow exercise prescription today without complaint.  Will continue to monitor for progression.   6 Minute Walk     Row Name 08/16/23 1526 10/25/23 1122       6 Minute Walk   Phase Initial Discharge    Distance 1150 feet 1185 feet    Distance % Change -- 3 %    Distance Feet Change -- 35 ft    Walk Time 6 minutes 6 minutes    # of Rest Breaks 0 0    MPH 2.18 2.24    METS 2.12 2.19    RPE 15 11    Perceived Dyspnea  1 1    VO2 Peak 7.42 7.65    Symptoms No No    Resting HR 71 bpm 75 bpm    Resting BP 138/70 102/70    Resting Oxygen Saturation  95 % 95 %    Exercise Oxygen Saturation  during 6 min walk 93 % 95 %    Max Ex. HR 99 bpm  103 bpm    Max Ex. BP 142/82 140/70    2 Minute Post BP 132/80 110/62                Dr. Bethann Punches is Medical Director for [facility] Cardiac Rehabilitation.  Dr. Vida Rigger is Medical Director for [facility] Pulmonary Rehabilitation.

## 2023-10-25 NOTE — Patient Instructions (Signed)
Discharge Patient Instructions  Patient Details  Name: Rebekah Gibson MRN: [id number] Date of Birth: 06-30-1946 Referring Provider:  Kandyce Rud, MD   Number of Visits: 48  Reason for Discharge:  Patient reached a stable level of exercise. Patient independent in their exercise. Patient has met program and personal goals.   Diagnosis:  S/P CABG x 2  Initial Exercise Prescription:  Initial Exercise Prescription - 08/16/23 1500       Date of Initial Exercise RX and Referring Provider   Date 08/16/23    Referring Provider Dr. Trixie Rude      Oxygen   Maintain Oxygen Saturation 88% or higher      Treadmill   MPH 1.5    Grade 0    Minutes 15    METs 2.15      Recumbant Bike   Level 2    RPM 50    Watts 15    Minutes 15    METs 2.12      REL-XR   Level 2    Speed 50    Minutes 15    METs 2.12      T5 Nustep   Level 2    SPM 80    Minutes 15    METs 2.12      Biostep-RELP   Level 2    SPM 50    Minutes 15    METs 2.12      Track   Laps 20    Minutes 15    METs 2.09      Prescription Details   Frequency (times per week) 3    Duration Progress to 30 minutes of continuous aerobic without signs/symptoms of physical distress      Intensity   THRR 40-80% of Max Heartrate 100-129    Ratings of Perceived Exertion 11-13    Perceived Dyspnea 0-4      Progression   Progression Continue to progress workloads to maintain intensity without signs/symptoms of physical distress.      Resistance Training   Training Prescription Yes    Weight 3    Reps 10-15             Discharge Exercise Prescription (Final Exercise Prescription Changes):  Exercise Prescription Changes - 10/13/23 0800       Response to Exercise   Blood Pressure (Admit) 126/60    Blood Pressure (Exit) 114/64    Heart Rate (Admit) 86 bpm    Heart Rate (Exercise) 107 bpm    Heart Rate (Exit) 91 bpm    Rating of Perceived Exertion (Exercise) 13    Symptoms none     Duration Continue with 30 min of aerobic exercise without signs/symptoms of physical distress.    Intensity THRR unchanged      Progression   Progression Continue to progress workloads to maintain intensity without signs/symptoms of physical distress.    Average METs 2.9      Resistance Training   Training Prescription Yes    Weight 3 lb    Reps 10-15      Interval Training   Interval Training No      Treadmill   MPH 1.9    Grade 0    Minutes 15    METs 2.45      REL-XR   Level 2    Minutes 15    METs 4.7      Biostep-RELP   Level 2    Minutes 15    METs  2      Track   Laps 30    Minutes 15    METs 2.63      Home Exercise Plan   Plans to continue exercise at Home (comment)   Walking on treadmill and outside, 3lb hand weights   Frequency Add 2 additional days to program exercise sessions.    Initial Home Exercises Provided 10/04/23      Oxygen   Maintain Oxygen Saturation 88% or higher             Functional Capacity:  6 Minute Walk     Row Name 08/16/23 1526 10/25/23 1122       6 Minute Walk   Phase Initial Discharge    Distance 1150 feet 1185 feet    Distance % Change -- 3 %    Distance Feet Change -- 35 ft    Walk Time 6 minutes 6 minutes    # of Rest Breaks 0 0    MPH 2.18 2.24    METS 2.12 2.19    RPE 15 11    Perceived Dyspnea  1 1    VO2 Peak 7.42 7.65    Symptoms No No    Resting HR 71 bpm 75 bpm    Resting BP 138/70 102/70    Resting Oxygen Saturation  95 % 95 %    Exercise Oxygen Saturation  during 6 min walk 93 % 95 %    Max Ex. HR 99 bpm 103 bpm    Max Ex. BP 142/82 140/70    2 Minute Post BP 132/80 110/62            Nutrition & Weight - Outcomes:  Pre Biometrics - 08/16/23 1542       Pre Biometrics   Height 5' (1.524 m)    Weight 154 lb (69.9 kg)    Waist Circumference 40 inches    Hip Circumference 44.5 inches    Waist to Hip Ratio 0.9 %    BMI (Calculated) 30.08    Single Leg Stand 4.66 seconds              Post Biometrics - 10/25/23 1123        Post  Biometrics   Height 5' (1.524 m)    Weight 156 lb 3.2 oz (70.9 kg)    Waist Circumference 39 inches    Hip Circumference 45 inches    Waist to Hip Ratio 0.87 %    BMI (Calculated) 30.51    Single Leg Stand 14.12 seconds            Goals reviewed with patient; copy given to patient.

## 2023-10-27 ENCOUNTER — Encounter: Payer: PPO | Admitting: *Deleted

## 2023-10-27 DIAGNOSIS — Z951 Presence of aortocoronary bypass graft: Secondary | ICD-10-CM

## 2023-10-27 DIAGNOSIS — Z48812 Encounter for surgical aftercare following surgery on the circulatory system: Secondary | ICD-10-CM | POA: Diagnosis not present

## 2023-10-27 NOTE — Progress Notes (Signed)
Cardiac Individual Treatment Plan  Patient Details  Name: Rebekah Gibson MRN: [id number] Date of Birth: 06-09-1946 Referring Provider:   Flowsheet Row Cardiac Rehab from 08/16/2023 in [facility] Cardiac and Pulmonary Rehab  Referring Provider Dr. Trixie Rude       Initial Encounter Date:  Flowsheet Row Cardiac Rehab from 08/16/2023 in [facility] Cardiac and Pulmonary Rehab  Date 08/16/23       Visit Diagnosis: S/P CABG x 2  Patient's Home Medications on Admission:  Current Outpatient Medications:    aspirin 81 MG chewable tablet, Chew by mouth., Disp: , Rfl:    atorvastatin (LIPITOR) 20 MG tablet, Take 1 tablet (20 mg total) by mouth at bedtime., Disp: , Rfl:    Cayenne 450 MG CAPS, Take 1 capsule by mouth as directed. (Patient not taking: Reported on 08/12/2023), Disp: , Rfl:    chlorhexidine (PERIDEX) 0.12 % solution, SMARTSIG:5-10 Milliliter(s) By Mouth Morning-Night (Patient not taking: Reported on 08/12/2023), Disp: , Rfl:    cholecalciferol (VITAMIN D3) 25 MCG (1000 UNIT) tablet, Take 1,000 Units by mouth daily., Disp: , Rfl:    Cinnamon 500 MG capsule, Take by mouth. (Patient not taking: Reported on 08/12/2023), Disp: , Rfl:    Cinnamon 500 MG TABS, Take 1 tablet by mouth as directed., Disp: , Rfl:    Cranberry 400 MG CAPS, Take 1 capsule by mouth as directed. (Patient not taking: Reported on 08/12/2023), Disp: , Rfl:    Cranberry 425 MG CAPS, Take by mouth., Disp: , Rfl:    diphenhydrAMINE (BENADRYL) 25 MG tablet, Take 25 mg by mouth every 6 (six) hours as needed (dizziness)., Disp: , Rfl:    empagliflozin (JARDIANCE) 10 MG TABS tablet, Take 10 mg by mouth daily., Disp: , Rfl:    ENTRESTO 49-51 MG, Take 1 tablet by mouth 2 (two) times daily., Disp: , Rfl:    Ginkgo Biloba 500 MG CAPS, Take 2,000 mg by mouth as directed. (Patient not taking: Reported on 08/12/2023), Disp: , Rfl:    heparin 78295 UT/250ML infusion, Inject 1,100 Units/hr into the vein continuous. (Patient not taking:  Reported on 08/12/2023), Disp: , Rfl:    insulin aspart (NOVOLOG) 100 UNIT/ML injection, Inject 0-15 Units into the skin 3 (three) times daily with meals. (Patient not taking: Reported on 08/12/2023), Disp: , Rfl:    Magnesium 500 MG TABS, Take 1 tablet by mouth as directed., Disp: , Rfl:    metoprolol succinate (TOPROL-XL) 50 MG 24 hr tablet, Take 50 mg by mouth daily., Disp: , Rfl:    Morphine Sulfate (MORPHINE, PF,) 2 MG/ML injection, Inject 1 mL (2 mg total) into the vein every 3 (three) hours as needed. (Patient not taking: Reported on 08/12/2023), Disp: 1 mL, Rfl: 0   olmesartan-hydrochlorothiazide (BENICAR HCT) 40-12.5 MG tablet, Take 1 tablet by mouth daily. (Patient not taking: Reported on 08/12/2023), Disp: , Rfl:   Past Medical History: Past Medical History:  Diagnosis Date   EKG, abnormal    Female bladder prolapse    Frequency of urination    History of UTI    History of vertigo    Hypertension    Nocturia    Pre-diabetes    DIET CONTROLLED    Tobacco Use: Social History   Tobacco Use  Smoking Status Never  Smokeless Tobacco Never    Labs: Review Flowsheet        No data to display           Exercise Target Goals: Exercise Program Goal:  Individual exercise prescription set using results from initial 6 min walk test and THRR while considering  patient's activity barriers and safety.   Exercise Prescription Goal: Initial exercise prescription builds to 30-45 minutes a day of aerobic activity, 2-3 days per week.  Home exercise guidelines will be given to patient during program as part of exercise prescription that the participant will acknowledge.   Education: Aerobic Exercise: - Group verbal and visual presentation on the components of exercise prescription. Introduces F.I.T.T principle from ACSM for exercise prescriptions.  Reviews F.I.T.T. principles of aerobic exercise including progression. Written material given at graduation. Flowsheet Row Cardiac Rehab  from 10/27/2023 in [facility] Cardiac and Pulmonary Rehab  Education need identified 08/16/23  Date 10/13/23  Educator NT  Instruction Review Code 1- Verbalizes Understanding       Education: Resistance Exercise: - Group verbal and visual presentation on the components of exercise prescription. Introduces F.I.T.T principle from ACSM for exercise prescriptions  Reviews F.I.T.T. principles of resistance exercise including progression. Written material given at graduation. Flowsheet Row Cardiac Rehab from 10/27/2023 in [facility] Cardiac and Pulmonary Rehab  Date 10/04/23  Educator NT  Instruction Review Code 1- Verbalizes Understanding        Education: Exercise & Equipment Safety: - Individual verbal instruction and demonstration of equipment use and safety with use of the equipment. Flowsheet Row Cardiac Rehab from 10/27/2023 in [facility] Cardiac and Pulmonary Rehab  Date 08/16/23  Educator [facility]  Instruction Review Code 1- Verbalizes Understanding       Education: Exercise Physiology & General Exercise Guidelines: - Group verbal and written instruction with models to review the exercise physiology of the cardiovascular system and associated critical values. Provides general exercise guidelines with specific guidelines to those with heart or lung disease.  Flowsheet Row Cardiac Rehab from 10/27/2023 in [facility] Cardiac and Pulmonary Rehab  Date 09/27/23  Educator [facility]  Instruction Review Code 1- [employer] Understanding       Education: Flexibility, Balance, Mind/Body Relaxation: - Group verbal and visual presentation with interactive activity on the components of exercise prescription. Introduces F.I.T.T principle from ACSM for exercise prescriptions. Reviews F.I.T.T. principles of flexibility and balance exercise training including progression. Also discusses the mind body connection.  Reviews various relaxation techniques to help reduce and manage stress (i.e. Deep breathing, progressive muscle  relaxation, and visualization). Balance handout provided to take home. Written material given at graduation. Flowsheet Row Cardiac Rehab from 10/27/2023 in [facility] Cardiac and Pulmonary Rehab  Date 10/04/23  Educator NT  Instruction Review Code 1- Verbalizes Understanding       Activity Barriers & Risk Stratification:  Activity Barriers & Cardiac Risk Stratification - 08/16/23 1527       Activity Barriers & Cardiac Risk Stratification   Activity Barriers Other (comment)    Comments spells of verdigo    Cardiac Risk Stratification High             6 Minute Walk:  6 Minute Walk     Row Name 08/16/23 1526 10/25/23 1122       6 Minute Walk   Phase Initial Discharge    Distance 1150 feet 1185 feet    Distance % Change -- 3 %    Distance Feet Change -- 35 ft    Walk Time 6 minutes 6 minutes    # of Rest Breaks 0 0    MPH 2.18 2.24    METS 2.12 2.19    RPE 15 11    Perceived Dyspnea  1 1    VO2 Peak 7.42 7.65    Symptoms No No    Resting HR 71 bpm 75 bpm    Resting BP 138/70 102/70    Resting Oxygen Saturation  95 % 95 %    Exercise Oxygen Saturation  during 6 min walk 93 % 95 %    Max Ex. HR 99 bpm 103 bpm    Max Ex. BP 142/82 140/70    2 Minute Post BP 132/80 110/62             Oxygen Initial Assessment:   Oxygen Re-Evaluation:   Oxygen Discharge (Final Oxygen Re-Evaluation):   Initial Exercise Prescription:  Initial Exercise Prescription - 08/16/23 1500       Date of Initial Exercise RX and Referring Provider   Date 08/16/23    Referring Provider Dr. Trixie Rude      Oxygen   Maintain Oxygen Saturation 88% or higher      Treadmill   MPH 1.5    Grade 0    Minutes 15    METs 2.15      Recumbant Bike   Level 2    RPM 50    Watts 15    Minutes 15    METs 2.12      REL-XR   Level 2    Speed 50    Minutes 15    METs 2.12      T5 Nustep   Level 2    SPM 80    Minutes 15    METs 2.12      Biostep-RELP   Level 2    SPM 50     Minutes 15    METs 2.12      Track   Laps 20    Minutes 15    METs 2.09      Prescription Details   Frequency (times per week) 3    Duration Progress to 30 minutes of continuous aerobic without signs/symptoms of physical distress      Intensity   THRR 40-80% of Max Heartrate 100-129    Ratings of Perceived Exertion 11-13    Perceived Dyspnea 0-4      Progression   Progression Continue to progress workloads to maintain intensity without signs/symptoms of physical distress.      Resistance Training   Training Prescription Yes    Weight 3    Reps 10-15             Perform Capillary Blood Glucose checks as needed.  Exercise Prescription Changes:   Exercise Prescription Changes     Row Name 08/16/23 1500 08/31/23 1400 09/16/23 1700 09/28/23 1200 10/04/23 1100     Response to Exercise   Blood Pressure (Admit) 138/70 132/70 122/68 122/64 --   Blood Pressure (Exercise) 142/82 122/70 138/68 130/58 --   Blood Pressure (Exit) 132/80 110/58 118/64 112/64 --   Heart Rate (Admit) 71 bpm 97 bpm 75 bpm 87 bpm --   Heart Rate (Exercise) 99 bpm 97 bpm 110 bpm 105 bpm --   Heart Rate (Exit) 81 bpm 82 bpm 80 bpm 84 bpm --   Oxygen Saturation (Admit) 95 % -- -- -- --   Oxygen Saturation (Exercise) 93 % -- -- -- --   Oxygen Saturation (Exit) 95 % -- -- -- --   Rating of Perceived Exertion (Exercise) 15 13 15 13  --   Perceived Dyspnea (Exercise) 1 -- 0 -- --   Symptoms none -- none  none --   Comments 6 MWT results -- -- -- --   Duration -- Continue with 30 min of aerobic exercise without signs/symptoms of physical distress. Continue with 30 min of aerobic exercise without signs/symptoms of physical distress. Continue with 30 min of aerobic exercise without signs/symptoms of physical distress. --   Intensity -- THRR unchanged THRR unchanged THRR unchanged --     Progression   Progression -- Continue to progress workloads to maintain intensity without signs/symptoms of physical  distress. Continue to progress workloads to maintain intensity without signs/symptoms of physical distress. Continue to progress workloads to maintain intensity without signs/symptoms of physical distress. --   Average METs -- 2 2.2 2.58 --     Resistance Training   Training Prescription -- Yes Yes Yes --   Weight -- 3 3 3  lb --   Reps -- 10-15 10-15 10-15 --     Interval Training   Interval Training -- No No No --     Treadmill   MPH -- -- 1.8 1.7 --   Grade -- -- 0 0 --   Minutes -- -- 15 15 --   METs -- -- 2.38 2.3 --     Recumbant Bike   Level -- -- 2 1 --   Watts -- -- 15 13 --   Minutes -- -- 15 15 --   METs -- -- 2.67 2.59 --     REL-XR   Level -- -- 2 2 --   Minutes -- -- 15 15 --   METs -- -- 3.1 4.6 --     T5 Nustep   Level -- 2 3 1  --   SPM -- 80 -- -- --   Minutes -- 15 15 15  --   METs -- 2 1.9 1.9 --     Biostep-RELP   Level -- 1 1 1  --   SPM -- 50 -- -- --   Minutes -- 15 15 15  --   METs -- 2 2 2  --     Track   Laps -- -- -- 30 --   Minutes -- -- -- 15 --   METs -- -- -- 2.63 --     Home Exercise Plan   Plans to continue exercise at -- -- -- -- Home (comment)  Walking on treadmill and outside, 3lb hand weights   Frequency -- -- -- -- Add 2 additional days to program exercise sessions.   Initial Home Exercises Provided -- -- -- -- 10/04/23     Oxygen   Maintain Oxygen Saturation -- -- 88% or higher 88% or higher 88% or higher    Row Name 10/13/23 0800 10/27/23 0800           Response to Exercise   Blood Pressure (Admit) 126/60 98/58      Blood Pressure (Exit) 114/64 102/60      Heart Rate (Admit) 86 bpm 74 bpm      Heart Rate (Exercise) 107 bpm 107 bpm      Heart Rate (Exit) 91 bpm 85 bpm      Oxygen Saturation (Admit) -- 95 %      Oxygen Saturation (Exercise) -- 95 %      Oxygen Saturation (Exit) -- 94 %      Rating of Perceived Exertion (Exercise) 13 13      Symptoms none none      Duration Continue with 30 min of aerobic exercise  without signs/symptoms of physical distress. Continue with  30 min of aerobic exercise without signs/symptoms of physical distress.      Intensity THRR unchanged THRR unchanged        Progression   Progression Continue to progress workloads to maintain intensity without signs/symptoms of physical distress. Continue to progress workloads to maintain intensity without signs/symptoms of physical distress.      Average METs 2.9 2.65        Resistance Training   Training Prescription Yes Yes      Weight 3 lb 3 lb      Reps 10-15 10-15        Interval Training   Interval Training No No        Treadmill   MPH 1.9 2      Grade 0 0      Minutes 15 15      METs 2.45 2.53        NuStep   Level -- 2      Minutes -- 15      METs -- 2.4        REL-XR   Level 2 2      Minutes 15 15      METs 4.7 3.6        Biostep-RELP   Level 2 2      Minutes 15 15      METs 2 2        Track   Laps 30 30      Minutes 15 15      METs 2.63 2.63        Home Exercise Plan   Plans to continue exercise at Home (comment)  Walking on treadmill and outside, 3lb hand weights Home (comment)  Walking on treadmill and outside, 3lb hand weights      Frequency Add 2 additional days to program exercise sessions. Add 2 additional days to program exercise sessions.      Initial Home Exercises Provided 10/04/23 10/04/23        Oxygen   Maintain Oxygen Saturation 88% or higher 88% or higher               Exercise Comments:   Exercise Comments     Row Name 08/25/23 1036 10/27/23 1116         Exercise Comments First full day of exercise!  Patient was oriented to gym and equipment including functions, settings, policies, and procedures.  Patient's individual exercise prescription and treatment plan were reviewed.  All starting workloads were established based on the results of the 6 minute walk test done at initial orientation visit.  The plan for exercise progression was also introduced and progression will  be customized based on patient's performance and goals. Devory graduated today from  rehab with 36 sessions completed.  Details of the patient's exercise prescription and what She needs to do in order to continue the prescription and progress were discussed with patient.  Patient was given a copy of prescription and goals.  Patient verbalized understanding. Franca plans to continue to exercise by walking outside and using her treadmill and handweights.               Exercise Goals and Review:   Exercise Goals     Row Name 08/16/23 1536             Exercise Goals   Increase Physical Activity Yes       Intervention Provide advice, education, support and counseling about physical activity/exercise needs.;Develop an individualized  exercise prescription for aerobic and resistive training based on initial evaluation findings, risk stratification, comorbidities and participant's personal goals.       Expected Outcomes Short Term: Attend rehab on a regular basis to increase amount of physical activity.;Long Term: Add in home exercise to make exercise part of routine and to increase amount of physical activity.;Long Term: Exercising regularly at least 3-5 days a week.       Increase Strength and Stamina Yes       Intervention Provide advice, education, support and counseling about physical activity/exercise needs.;Develop an individualized exercise prescription for aerobic and resistive training based on initial evaluation findings, risk stratification, comorbidities and participant's personal goals.       Expected Outcomes Short Term: Increase workloads from initial exercise prescription for resistance, speed, and METs.;Short Term: Perform resistance training exercises routinely during rehab and add in resistance training at home;Long Term: Improve cardiorespiratory fitness, muscular endurance and strength as measured by increased METs and functional capacity ( )       Able to understand and use  rate of perceived exertion (RPE) scale Yes       Intervention Provide education and explanation on how to use RPE scale       Expected Outcomes Short Term: Able to use RPE daily in rehab to express subjective intensity level;Long Term:  Able to use RPE to guide intensity level when exercising independently       Able to understand and use Dyspnea scale Yes       Intervention Provide education and explanation on how to use Dyspnea scale       Expected Outcomes Short Term: Able to use Dyspnea scale daily in rehab to express subjective sense of shortness of breath during exertion;Long Term: Able to use Dyspnea scale to guide intensity level when exercising independently       Knowledge and understanding of Target Heart Rate Range (THRR) Yes       Intervention Provide education and explanation of THRR including how the numbers were predicted and where they are located for reference       Expected Outcomes Short Term: Able to state/look up THRR;Long Term: Able to use THRR to govern intensity when exercising independently;Short Term: Able to use daily as guideline for intensity in rehab       Able to check pulse independently Yes       Intervention Provide education and demonstration on how to check pulse in carotid and radial arteries.;Review the importance of being able to check your own pulse for safety during independent exercise       Expected Outcomes Short Term: Able to explain why pulse checking is important during independent exercise;Long Term: Able to check pulse independently and accurately       Understanding of Exercise Prescription Yes       Intervention Provide education, explanation, and written materials on patient's individual exercise prescription       Expected Outcomes Short Term: Able to explain program exercise prescription;Long Term: Able to explain home exercise prescription to exercise independently                Exercise Goals Re-Evaluation :  Exercise Goals  Re-Evaluation     Row Name 08/25/23 1036 08/31/23 1444 09/16/23 1709 09/28/23 1214 10/04/23 1142     Exercise Goal Re-Evaluation   Exercise Goals Review Able to understand and use rate of perceived exertion (RPE) scale;Able to understand and use Dyspnea scale;Knowledge and understanding of Target Heart Rate Range (  THRR);Understanding of Exercise Prescription Increase Physical Activity;Understanding of Exercise Prescription;Increase Strength and Stamina Increase Physical Activity;Understanding of Exercise Prescription;Increase Strength and Stamina Increase Physical Activity;Understanding of Exercise Prescription;Increase Strength and Stamina Increase Physical Activity;Understanding of Exercise Prescription;Increase Strength and Stamina;Knowledge and understanding of Target Heart Rate Range (THRR);Able to understand and use rate of perceived exertion (RPE) scale;Able to understand and use Dyspnea scale;Able to check pulse independently   Comments Reviewed RPE and dyspnea scale, THR and program prescription with pt today.  Pt voiced understanding and was given a copy of goals to take home. Siclaly has completed her first session. She tolerated her current exercise prescription well with level 1 on the biostep, level 2 on the T5 nustep, and 3 lb weights. We will continue to monitor her progress in the program. Willodeen has been doing well in rehab. She was able to increase her level on the T5 nustep from level 2 to 3. She was also able to increase her speed on the treadmil from 1.5 mph to 1.66mph. We will continue to monitor her progress in the program. Denisia is doing well in rehab. She recently increased her number of laps walked on the track up to 30 laps. She also has worked at level 1 on the biostep, recumbent bike, and T5 nustep, and level 2 on the XR. We will continue to monitor her progress in the program. Reviewed home exercise with pt today.  Pt plans to walk on her treadmill, walk outside, and use her personal  3 lb hand weights for exercise.  Reviewed THR, pulse, RPE, sign and symptoms, pulse oximetery and when to call 911 or MD.  Also discussed weather considerations and indoor options.  Pt voiced understanding.   Expected Outcomes Short: Use RPE daily to regulate intensity. Long: Follow program prescription in THR. Short: Continue to follow current exercise prescription. Long: Continue exercise to improve strength and stamina. Short: Continue to follow current exercise prescription. Long: Continue exercise to improve strength and stamina. Short: Progressively increase workloads on seated machines. Long: Continue exercise to improve strength and stamina. Short: Begin walking at home on days away from rehab. Long: Continue exercise to improve strength and stamina.    Row Name 10/13/23 1610 10/27/23 0854           Exercise Goal Re-Evaluation   Exercise Goals Review Increase Physical Activity;Increase Strength and Stamina;Understanding of Exercise Prescription Increase Physical Activity;Increase Strength and Stamina;Understanding of Exercise Prescription      Comments Laysa is doing well in rehab. She has been able to increase her speed on the treadmill to 1. , she was also able to increase her level on the biostep to level 2. We will continue to monitor her progress in the program. Kyung continues to do well in rehab. She is set to graduate soon. She increased her speed to on the treadmill, maintained level 2 on the XR and 30 laps on the track. We will continue to monitor her progress in the program.      Expected Outcomes Short: Continue to increase treadmill workloads. Long: Continue exercise to improve strength and stamina. Short: Continue to progressively increase treadmill and XR workloads. Long: Continue exercise to improve strength and stamina and graduate from program.               Discharge Exercise Prescription (Final Exercise Prescription Changes):  Exercise Prescription Changes -  10/27/23 0800       Response to Exercise   Blood Pressure (Admit) 98/58  Blood Pressure (Exit) 102/60    Heart Rate (Admit) 74 bpm    Heart Rate (Exercise) 107 bpm    Heart Rate (Exit) 85 bpm    Oxygen Saturation (Admit) 95 %    Oxygen Saturation (Exercise) 95 %    Oxygen Saturation (Exit) 94 %    Rating of Perceived Exertion (Exercise) 13    Symptoms none    Duration Continue with 30 min of aerobic exercise without signs/symptoms of physical distress.    Intensity THRR unchanged      Progression   Progression Continue to progress workloads to maintain intensity without signs/symptoms of physical distress.    Average METs 2.65      Resistance Training   Training Prescription Yes    Weight 3 lb    Reps 10-15      Interval Training   Interval Training No      Treadmill   MPH 2    Grade 0    Minutes 15    METs 2.53      NuStep   Level 2    Minutes 15    METs 2.4      REL-XR   Level 2    Minutes 15    METs 3.6      Biostep-RELP   Level 2    Minutes 15    METs 2      Track   Laps 30    Minutes 15    METs 2.63      Home Exercise Plan   Plans to continue exercise at Home (comment)   Walking on treadmill and outside, 3lb hand weights   Frequency Add 2 additional days to program exercise sessions.    Initial Home Exercises Provided 10/04/23      Oxygen   Maintain Oxygen Saturation 88% or higher             Nutrition:  Target Goals: Understanding of nutrition guidelines, daily intake of sodium 1500mg , cholesterol 200mg , calories 30% from fat and 7% or less from saturated fats, daily to have 5 or more servings of fruits and vegetables.  Education: All About Nutrition: -Group instruction provided by verbal, written material, interactive activities, discussions, models, and posters to present general guidelines for heart healthy nutrition including fat, fiber, MyPlate, the role of sodium in heart healthy nutrition, utilization of the nutrition label,  and utilization of this knowledge for meal planning. Follow up email sent as well. Written material given at graduation. Flowsheet Row Cardiac Rehab from 10/27/2023 in [facility] Cardiac and Pulmonary Rehab  Date 10/27/23  Educator JG part 2  Instruction Review Code 1- Verbalizes Understanding       Biometrics:  Pre Biometrics - 08/16/23 1542       Pre Biometrics   Height 5' (1.524 m)    Weight 154 lb (69.9 kg)    Waist Circumference 40 inches    Hip Circumference 44.5 inches    Waist to Hip Ratio 0.9 %    BMI (Calculated) 30.08    Single Leg Stand 4.66 seconds             Post Biometrics - 10/25/23 1123        Post  Biometrics   Height 5' (1.524 m)    Weight 156 lb 3.2 oz (70.9 kg)    Waist Circumference 39 inches    Hip Circumference 45 inches    Waist to Hip Ratio 0.87 %    BMI (Calculated) 30.51  Single Leg Stand 14.12 seconds             Nutrition Therapy Plan and Nutrition Goals:   Nutrition Assessments:  MEDIFICTS Score Key: >=70 Need to make dietary changes  40-70 Heart Healthy Diet <= 40 Therapeutic Level Cholesterol Diet  Flowsheet Row Cardiac Rehab from 10/27/2023 in [facility] Cardiac and Pulmonary Rehab  Picture Your Plate Total Score on Discharge 73      Picture Your Plate Scores: <96 Unhealthy dietary pattern with much room for improvement. 41-50 Dietary pattern unlikely to meet recommendations for good health and room for improvement. 51-60 More healthful dietary pattern, with some room for improvement.  >60 Healthy dietary pattern, although there may be some specific behaviors that could be improved.    Nutrition Goals Re-Evaluation:  Nutrition Goals Re-Evaluation     Row Name 10/04/23 1128             Goals   Comment Ravynn states that she is still working on dietary patterns she discussed with the RD. She states having too much sugar over christmas, but is doing overall limiting unhealthy foods. She states that she is continuing to  try and limit sodium intake. She reports that she is not drinking enough water but is trying to drink more.       Expected Outcome Short: Continue to work on drinking plenty of water. Long: Continue to practice heart healthy eating patterns.                Nutrition Goals Discharge (Final Nutrition Goals Re-Evaluation):  Nutrition Goals Re-Evaluation - 10/04/23 1128       Goals   Comment Adilyne states that she is still working on dietary patterns she discussed with the RD. She states having too much sugar over christmas, but is doing overall limiting unhealthy foods. She states that she is continuing to try and limit sodium intake. She reports that she is not drinking enough water but is trying to drink more.    Expected Outcome Short: Continue to work on drinking plenty of water. Long: Continue to practice heart healthy eating patterns.             Psychosocial: Target Goals: Acknowledge presence or absence of significant depression and/or stress, maximize coping skills, provide positive support system. Participant is able to verbalize types and ability to use techniques and skills needed for reducing stress and depression.   Education: Stress, Anxiety, and Depression - Group verbal and visual presentation to define topics covered.  Reviews how body is impacted by stress, anxiety, and depression.  Also discusses healthy ways to reduce stress and to treat/manage anxiety and depression.  Written material given at graduation. Flowsheet Row Cardiac Rehab from 10/27/2023 in [facility] Cardiac and Pulmonary Rehab  Date 09/22/23  Educator SB  Instruction Review Code 1- [employer] Understanding       Education: Sleep Hygiene -Provides group verbal and written instruction about how sleep can affect your health.  Define sleep hygiene, discuss sleep cycles and impact of sleep habits. Review good sleep hygiene tips.    Initial Review & Psychosocial Screening:  Initial Psych Review & Screening -  08/12/23 1017       Initial Review   Current issues with None Identified      Family Dynamics   Good Support System? Yes    Comments Ellec has a good family support system and can look to her five children for support. She does not take any medications for her mood.  Barriers   Psychosocial barriers to participate in program The patient should benefit from training in stress management and relaxation.;There are no identifiable barriers or psychosocial needs.      Screening Interventions   Interventions To provide support and resources with identified psychosocial needs;Encouraged to exercise;Provide feedback about the scores to participant    Expected Outcomes Short Term goal: Utilizing psychosocial counselor, staff and physician to assist with identification of specific Stressors or current issues interfering with healing process. Setting desired goal for each stressor or current issue identified.;Long Term Goal: Stressors or current issues are controlled or eliminated.;Short Term goal: Identification and review with participant of any Quality of Life or Depression concerns found by scoring the questionnaire.;Long Term goal: The participant improves quality of Life and PHQ9 Scores as seen by post scores and/or verbalization of changes             Quality of Life Scores:   Quality of Life - 10/27/23 1004       Quality of Life   Select Quality of Life      Quality of Life Scores   Health/Function Post 24.75 %    Socioeconomic Post 24.25 %    Psych/Spiritual Post 27 %    Family Post 25.5 %    GLOBAL Post 25.26 %            Scores of 19 and below usually indicate a poorer quality of life in these areas.  A difference of  2-3 points is a clinically meaningful difference.  A difference of 2-3 points in the total score of the Quality of Life Index has been associated with significant improvement in overall quality of life, self-image, physical symptoms, and general health in  studies assessing change in quality of life.  PHQ-9: Review Flowsheet       10/27/2023 08/16/2023  Depression screen PHQ 2/9  Decreased Interest 0 0  Down, Depressed, Hopeless 0 0  PHQ - 2 Score 0 0  Altered sleeping 1 1  Tired, decreased energy 1 1  Change in appetite 1 0  Feeling bad or failure about yourself  0 0  Trouble concentrating 0 0  Moving slowly or fidgety/restless 0 0  Suicidal thoughts 0 0  PHQ-9 Score 3 2  Difficult doing work/chores Not difficult at all Not difficult at all   Interpretation of Total Score  Total Score Depression Severity:  1-4 = Minimal depression, 5-9 = Mild depression, 10-14 = Moderate depression, 15-19 = Moderately severe depression, 20-27 = Severe depression   Psychosocial Evaluation and Intervention:  Psychosocial Evaluation - 08/12/23 1018       Psychosocial Evaluation & Interventions   Interventions Relaxation education;Stress management education;Encouraged to exercise with the program and follow exercise prescription    Comments Ellec has a good family support system and can look to her five children for support. She does not take any medications for her mood.    Expected Outcomes Short: Start [facility] to help with mood. Long: Maintain a healthy mental state    Continue Psychosocial Services  Follow up required by staff             Psychosocial Re-Evaluation:  Psychosocial Re-Evaluation     Row Name 10/04/23 1124             Psychosocial Re-Evaluation   Current issues with Current Stress Concerns       Comments Justa states that she is dealing with some vertigo which has caused some stress for her.  She states that she is doing well overall, except for her children not allowing her to drive. She states that she is sleeping well at this time. She states that her five children are a good support system for her and they help her cook and clean. She also enjoys reading, puzzles and crocheting for stress relief.       Expected  Outcomes Short: Continue to attend cardiac rehab for stress relief. Long: Continue to maintain positive outlook.       Interventions Encouraged to attend Cardiac Rehabilitation for the exercise       Continue Psychosocial Services  Follow up required by staff                Psychosocial Discharge (Final Psychosocial Re-Evaluation):  Psychosocial Re-Evaluation - 10/04/23 1124       Psychosocial Re-Evaluation   Current issues with Current Stress Concerns    Comments Azaylah states that she is dealing with some vertigo which has caused some stress for her. She states that she is doing well overall, except for her children not allowing her to drive. She states that she is sleeping well at this time. She states that her five children are a good support system for her and they help her cook and clean. She also enjoys reading, puzzles and crocheting for stress relief.    Expected Outcomes Short: Continue to attend cardiac rehab for stress relief. Long: Continue to maintain positive outlook.    Interventions Encouraged to attend Cardiac Rehabilitation for the exercise    Continue Psychosocial Services  Follow up required by staff             Vocational Rehabilitation: Provide vocational rehab assistance to qualifying candidates.   Vocational Rehab Evaluation & Intervention:   Education: Education Goals: Education classes will be provided on a variety of topics geared toward better understanding of heart health and risk factor modification. Participant will state understanding/return demonstration of topics presented as noted by education test scores.  Learning Barriers/Preferences:  Learning Barriers/Preferences - 08/12/23 1016       Learning Barriers/Preferences   Learning Barriers None    Learning Preferences None             General Cardiac Education Topics:  AED/CPR: - Group verbal and written instruction with the use of models to demonstrate the basic use of the AED  with the basic ABC's of resuscitation.   Anatomy and Cardiac Procedures: - Group verbal and visual presentation and models provide information about basic cardiac anatomy and function. Reviews the testing methods done to diagnose heart disease and the outcomes of the test results. Describes the treatment choices: Medical Management, Angioplasty, or Coronary Bypass Surgery for treating various heart conditions including Myocardial Infarction, Angina, Valve Disease, and Cardiac Arrhythmias.  Written material given at graduation. Flowsheet Row Cardiac Rehab from 10/27/2023 in [facility] Cardiac and Pulmonary Rehab  Education need identified 08/16/23  Date 08/25/23  Educator SB  Instruction Review Code 1- Verbalizes Understanding       Medication Safety: - Group verbal and visual instruction to review commonly prescribed medications for heart and lung disease. Reviews the medication, class of the drug, and side effects. Includes the steps to properly store meds and maintain the prescription regimen.  Written material given at graduation. Flowsheet Row Cardiac Rehab from 10/27/2023 in [facility] Cardiac and Pulmonary Rehab  Date 09/01/23  Educator SB  Instruction Review Code 1- Verbalizes Understanding       Intimacy: - Group verbal  instruction through game format to discuss how heart and lung disease can affect sexual intimacy. Written material given at graduation.. Flowsheet Row Cardiac Rehab from 10/27/2023 in [facility] Cardiac and Pulmonary Rehab  Date 10/13/23  Educator NT  Instruction Review Code 1- Verbalizes Understanding       Know Your Numbers and Heart Failure: - Group verbal and visual instruction to discuss disease risk factors for cardiac and pulmonary disease and treatment options.  Reviews associated critical values for Overweight/Obesity, Hypertension, Cholesterol, and Diabetes.  Discusses basics of heart failure: signs/symptoms and treatments.  Introduces Heart Failure Zone chart for  action plan for heart failure.  Written material given at graduation. Flowsheet Row Cardiac Rehab from 10/27/2023 in [facility] Cardiac and Pulmonary Rehab  Date 09/08/23  Educator SB  Instruction Review Code 1- Verbalizes Understanding       Infection Prevention: - Provides verbal and written material to individual with discussion of infection control including proper hand washing and proper equipment cleaning during exercise session. Flowsheet Row Cardiac Rehab from 10/27/2023 in [facility] Cardiac and Pulmonary Rehab  Date 08/16/23  Educator [facility]  Instruction Review Code 1- Verbalizes Understanding       Falls Prevention: - Provides verbal and written material to individual with discussion of falls prevention and safety. Flowsheet Row Cardiac Rehab from 10/27/2023 in [facility] Cardiac and Pulmonary Rehab  Date 08/16/23  Educator [facility]  Instruction Review Code 1- Verbalizes Understanding       Other: -Provides group and verbal instruction on various topics (see comments)   Knowledge Questionnaire Score:  Knowledge Questionnaire Score - 10/27/23 1004       Knowledge Questionnaire Score   Pre Score 26/26             Core Components/Risk Factors/Patient Goals at Admission:  Personal Goals and Risk Factors at Admission - 08/16/23 1546       Core Components/Risk Factors/Patient Goals on Admission    Weight Management Yes;Weight Loss    Intervention Weight Management: Develop a combined nutrition and exercise program designed to reach desired caloric intake, while maintaining appropriate intake of nutrient and fiber, sodium and fats, and appropriate energy expenditure required for the weight goal.;Weight Management: Provide education and appropriate resources to help participant work on and attain dietary goals.;Weight Management/Obesity: Establish reasonable short term and long term weight goals.    Admit Weight 154 lb (69.9 kg)    Goal Weight: Short Term 148 lb (67.1 kg)    Goal Weight:  Long Term 140 lb (63.5 kg)    Expected Outcomes Short Term: Continue to assess and modify interventions until short term weight is achieved;Long Term: Adherence to nutrition and physical activity/exercise program aimed toward attainment of established weight goal;Weight Loss: Understanding of general recommendations for a balanced deficit meal plan, which promotes 1-2 lb weight loss per week and includes a negative energy balance of [phone] kcal/d;Understanding recommendations for meals to include 15-35% energy as protein, 25-35% energy from fat, 35-60% energy from carbohydrates, less than 200mg  of dietary cholesterol, 20-35 gm of total fiber daily;Understanding of distribution of calorie intake throughout the day with the consumption of 4-5 meals/snacks    Diabetes Yes    Intervention Provide education about signs/symptoms and action to take for hypo/hyperglycemia.;Provide education about proper nutrition, including hydration, and aerobic/resistive exercise prescription along with prescribed medications to achieve blood glucose in normal ranges: Fasting glucose 65-99 mg/dL    Expected Outcomes Short Term: Participant verbalizes understanding of the signs/symptoms and immediate care of hyper/hypoglycemia, proper  foot care and importance of medication, aerobic/resistive exercise and nutrition plan for blood glucose control.;Long Term: Attainment of HbA1C < 7%.    Heart Failure Yes    Intervention Provide a combined exercise and nutrition program that is supplemented with education, support and counseling about heart failure. Directed toward relieving symptoms such as shortness of breath, decreased exercise tolerance, and extremity edema.    Expected Outcomes Improve functional capacity of life;Short term: Attendance in program 2-3 days a week with increased exercise capacity. Reported lower sodium intake. Reported increased fruit and vegetable intake. Reports medication compliance.;Short term: Daily weights  obtained and reported for increase. Utilizing diuretic protocols set by physician.;Long term: Adoption of self-care skills and reduction of barriers for early signs and symptoms recognition and intervention leading to self-care maintenance.    Hypertension Yes    Intervention Provide education on lifestyle modifcations including regular physical activity/exercise, weight management, moderate sodium restriction and increased consumption of fresh fruit, vegetables, and low fat dairy, alcohol moderation, and smoking cessation.;Monitor prescription use compliance.    Expected Outcomes Short Term: Continued assessment and intervention until BP is < 140/65mm HG in hypertensive participants. < 130/48mm HG in hypertensive participants with diabetes, heart failure or chronic kidney disease.;Long Term: Maintenance of blood pressure at goal levels.    Lipids Yes    Intervention Provide education and support for participant on nutrition & aerobic/resistive exercise along with prescribed medications to achieve LDL 70mg , HDL >40mg .    Expected Outcomes Short Term: Participant states understanding of desired cholesterol values and is compliant with medications prescribed. Participant is following exercise prescription and nutrition guidelines.;Long Term: Cholesterol controlled with medications as prescribed, with individualized exercise RX and with personalized nutrition plan. Value goals: LDL < 70mg , HDL > 40 mg.             Education:Diabetes - Individual verbal and written instruction to review signs/symptoms of diabetes, desired ranges of glucose level fasting, after meals and with exercise. Acknowledge that pre and post exercise glucose checks will be done for 3 sessions at entry of program. Flowsheet Row Cardiac Rehab from 10/27/2023 in [facility] Cardiac and Pulmonary Rehab  Date 08/16/23  Educator [facility]  Instruction Review Code 1- Verbalizes Understanding       Core Components/Risk Factors/Patient Goals  Review:   Goals and Risk Factor Review     Row Name 10/04/23 1130             Core Components/Risk Factors/Patient Goals Review   Personal Goals Review Diabetes;Hypertension;Weight Management/Obesity       Review Shiza states that her weight has been creeping up some. She wants to work on losing some weight as she weighed in today at 156 lb and has a weight goal of 120 lbs.She is checking her BP at home every morning, and reports that it has stayed within normal ranges. Her sugar has been well controlled as well, as she checks it every morning also.       Expected Outcomes Short: Continue to work towards weight goal through diet and exercise. Long: Continue to manage lifestyle risk factors.                Core Components/Risk Factors/Patient Goals at Discharge (Final Review):   Goals and Risk Factor Review - 10/04/23 1130       Core Components/Risk Factors/Patient Goals Review   Personal Goals Review Diabetes;Hypertension;Weight Management/Obesity    Review Tonjua states that her weight has been creeping up some. She wants to work on  losing some weight as she weighed in today at 156 lb and has a weight goal of 120 lbs.She is checking her BP at home every morning, and reports that it has stayed within normal ranges. Her sugar has been well controlled as well, as she checks it every morning also.    Expected Outcomes Short: Continue to work towards weight goal through diet and exercise. Long: Continue to manage lifestyle risk factors.             ITP Comments:  ITP Comments     Row Name 08/12/23 1015 08/16/23 1525 08/25/23 1035 08/25/23 1124 09/22/23 0940   ITP Comments Virtual Visit completed. Patient informed on EP and RD appointment and 6 Minute walk test. Patient also informed of patient health questionnaires on My Chart. Patient Verbalizes understanding. Visit diagnosis can be found in [facility] 07/06/2023. Completed and gym orientation. Initial ITP created and sent for review  to Dr. Bethann Punches, Medical Director. First full day of exercise!  Patient was oriented to gym and equipment including functions, settings, policies, and procedures.  Patient's individual exercise prescription and treatment plan were reviewed.  All starting workloads were established based on the results of the 6 minute walk test done at initial orientation visit.  The plan for exercise progression was also introduced and progression will be customized based on patient's performance and goals. 30 Day review completed. Medical Director ITP review done, changes made as directed, and signed approval by Medical Director.    new to program 30 Day review completed. Medical Director ITP review done, changes made as directed, and signed approval by Medical Director.    Row Name 10/20/23 1247 10/27/23 1116         ITP Comments 30 Day review completed. Medical Director ITP review done, changes made as directed, and signed approval by Medical Director. Aradhana graduated today from  rehab with 36 sessions completed.  Details of the patient's exercise prescription and what She needs to do in order to continue the prescription and progress were discussed with patient.  Patient was given a copy of prescription and goals.  Patient verbalized understanding. Gearl plans to continue to exercise by walking outside and using her treadmill and handweights.               Comments: Discharge ITp  with scores

## 2023-10-27 NOTE — Progress Notes (Signed)
Cardiac Individual Treatment Plan  Patient Details  Name: Rebekah Gibson MRN: [id number] Date of Birth: [birth year] Referring Provider:   Flowsheet Row Cardiac Rehab from 08/16/2023 in [facility] Cardiac and Pulmonary Rehab  Referring Provider Dr. Trixie Rude       Initial Encounter Date:  Flowsheet Row Cardiac Rehab from 08/16/2023 in [facility] Cardiac and Pulmonary Rehab  Date 08/16/23       Visit Diagnosis: S/P CABG x 2  Patient's Home Medications on Admission:  Current Outpatient Medications:    aspirin 81 MG chewable tablet, Chew by mouth., Disp: , Rfl:    atorvastatin (LIPITOR) 20 MG tablet, Take 1 tablet (20 mg total) by mouth at bedtime., Disp: , Rfl:    Cayenne 450 MG CAPS, Take 1 capsule by mouth as directed. (Patient not taking: Reported on 08/12/2023), Disp: , Rfl:    chlorhexidine (PERIDEX) 0.12 % solution, SMARTSIG:5-10 Milliliter(s) By Mouth Morning-Night (Patient not taking: Reported on 08/12/2023), Disp: , Rfl:    cholecalciferol (VITAMIN D3) 25 MCG (1000 UNIT) tablet, Take 1,000 Units by mouth daily., Disp: , Rfl:    Cinnamon 500 MG capsule, Take by mouth. (Patient not taking: Reported on 08/12/2023), Disp: , Rfl:    Cinnamon 500 MG TABS, Take 1 tablet by mouth as directed., Disp: , Rfl:    Cranberry 400 MG CAPS, Take 1 capsule by mouth as directed. (Patient not taking: Reported on 08/12/2023), Disp: , Rfl:    Cranberry 425 MG CAPS, Take by mouth., Disp: , Rfl:    diphenhydrAMINE (BENADRYL) 25 MG tablet, Take 25 mg by mouth every 6 (six) hours as needed (dizziness)., Disp: , Rfl:    empagliflozin (JARDIANCE) 10 MG TABS tablet, Take 10 mg by mouth daily., Disp: , Rfl:    ENTRESTO 49-51 MG, Take 1 tablet by mouth 2 (two) times daily., Disp: , Rfl:    Ginkgo Biloba 500 MG CAPS, Take 2,000 mg by mouth as directed. (Patient not taking: Reported on 08/12/2023), Disp: , Rfl:    heparin 40981 UT/250ML infusion, Inject 1,100 Units/hr into the vein continuous. (Patient not taking:  Reported on 08/12/2023), Disp: , Rfl:    insulin aspart (NOVOLOG) 100 UNIT/ML injection, Inject 0-15 Units into the skin 3 (three) times daily with meals. (Patient not taking: Reported on 08/12/2023), Disp: , Rfl:    Magnesium 500 MG TABS, Take 1 tablet by mouth as directed., Disp: , Rfl:    metoprolol succinate (TOPROL-XL) 50 MG 24 hr tablet, Take 50 mg by mouth daily., Disp: , Rfl:    Morphine Sulfate (MORPHINE, PF,) 2 MG/ML injection, Inject 1 mL (2 mg total) into the vein every 3 (three) hours as needed. (Patient not taking: Reported on 08/12/2023), Disp: 1 mL, Rfl: 0   olmesartan-hydrochlorothiazide (BENICAR HCT) 40-12.5 MG tablet, Take 1 tablet by mouth daily. (Patient not taking: Reported on 08/12/2023), Disp: , Rfl:   Past Medical History: Past Medical History:  Diagnosis Date   EKG, abnormal    Female bladder prolapse    Frequency of urination    History of UTI    History of vertigo    Hypertension    Nocturia    Pre-diabetes    DIET CONTROLLED    Tobacco Use: Social History   Tobacco Use  Smoking Status Never  Smokeless Tobacco Never    Labs: Review Flowsheet        No data to display           Exercise Target Goals: Exercise Program Goal:  Individual exercise prescription set using results from initial 6 min walk test and THRR while considering  patient's activity barriers and safety.   Exercise Prescription Goal: Initial exercise prescription builds to 30-45 minutes a day of aerobic activity, 2-3 days per week.  Home exercise guidelines will be given to patient during program as part of exercise prescription that the participant will acknowledge.   Education: Aerobic Exercise: - Group verbal and visual presentation on the components of exercise prescription. Introduces F.I.T.T principle from ACSM for exercise prescriptions.  Reviews F.I.T.T. principles of aerobic exercise including progression. Written material given at graduation. Flowsheet Row Cardiac Rehab  from 10/27/2023 in [facility] Cardiac and Pulmonary Rehab  Education need identified 08/16/23  Date 10/13/23  Educator NT  Instruction Review Code 1- Verbalizes Understanding       Education: Resistance Exercise: - Group verbal and visual presentation on the components of exercise prescription. Introduces F.I.T.T principle from ACSM for exercise prescriptions  Reviews F.I.T.T. principles of resistance exercise including progression. Written material given at graduation. Flowsheet Row Cardiac Rehab from 10/27/2023 in [facility] Cardiac and Pulmonary Rehab  Date 10/04/23  Educator NT  Instruction Review Code 1- Verbalizes Understanding        Education: Exercise & Equipment Safety: - Individual verbal instruction and demonstration of equipment use and safety with use of the equipment. Flowsheet Row Cardiac Rehab from 10/27/2023 in [facility] Cardiac and Pulmonary Rehab  Date 08/16/23  Educator [facility]  Instruction Review Code 1- Verbalizes Understanding       Education: Exercise Physiology & General Exercise Guidelines: - Group verbal and written instruction with models to review the exercise physiology of the cardiovascular system and associated critical values. Provides general exercise guidelines with specific guidelines to those with heart or lung disease.  Flowsheet Row Cardiac Rehab from 10/27/2023 in [facility] Cardiac and Pulmonary Rehab  Date 09/27/23  Educator [facility]  Instruction Review Code 1- [employer] Understanding       Education: Flexibility, Balance, Mind/Body Relaxation: - Group verbal and visual presentation with interactive activity on the components of exercise prescription. Introduces F.I.T.T principle from ACSM for exercise prescriptions. Reviews F.I.T.T. principles of flexibility and balance exercise training including progression. Also discusses the mind body connection.  Reviews various relaxation techniques to help reduce and manage stress (i.e. Deep breathing, progressive muscle  relaxation, and visualization). Balance handout provided to take home. Written material given at graduation. Flowsheet Row Cardiac Rehab from 10/27/2023 in [facility] Cardiac and Pulmonary Rehab  Date 10/04/23  Educator NT  Instruction Review Code 1- Verbalizes Understanding       Activity Barriers & Risk Stratification:  Activity Barriers & Cardiac Risk Stratification - 08/16/23 1527       Activity Barriers & Cardiac Risk Stratification   Activity Barriers Other (comment)    Comments spells of verdigo    Cardiac Risk Stratification High             6 Minute Walk:  6 Minute Walk     Row Name 08/16/23 1526 10/25/23 1122       6 Minute Walk   Phase Initial Discharge    Distance 1150 feet 1185 feet    Distance % Change -- 3 %    Distance Feet Change -- 35 ft    Walk Time 6 minutes 6 minutes    # of Rest Breaks 0 0    MPH 2.18 2.24    METS 2.12 2.19    RPE 15 11    Perceived Dyspnea  1 1    VO2 Peak 7.42 7.65    Symptoms No No    Resting HR 71 bpm 75 bpm    Resting BP 138/70 102/70    Resting Oxygen Saturation  95 % 95 %    Exercise Oxygen Saturation  during 6 min walk 93 % 95 %    Max Ex. HR 99 bpm 103 bpm    Max Ex. BP 142/82 140/70    2 Minute Post BP 132/80 110/62             Oxygen Initial Assessment:   Oxygen Re-Evaluation:   Oxygen Discharge (Final Oxygen Re-Evaluation):   Initial Exercise Prescription:  Initial Exercise Prescription - 08/16/23 1500       Date of Initial Exercise RX and Referring Provider   Date 08/16/23    Referring Provider Dr. Trixie Rude      Oxygen   Maintain Oxygen Saturation 88% or higher      Treadmill   MPH 1.5    Grade 0    Minutes 15    METs 2.15      Recumbant Bike   Level 2    RPM 50    Watts 15    Minutes 15    METs 2.12      REL-XR   Level 2    Speed 50    Minutes 15    METs 2.12      T5 Nustep   Level 2    SPM 80    Minutes 15    METs 2.12      Biostep-RELP   Level 2    SPM 50     Minutes 15    METs 2.12      Track   Laps 20    Minutes 15    METs 2.09      Prescription Details   Frequency (times per week) 3    Duration Progress to 30 minutes of continuous aerobic without signs/symptoms of physical distress      Intensity   THRR 40-80% of Max Heartrate 100-129    Ratings of Perceived Exertion 11-13    Perceived Dyspnea 0-4      Progression   Progression Continue to progress workloads to maintain intensity without signs/symptoms of physical distress.      Resistance Training   Training Prescription Yes    Weight 3    Reps 10-15             Perform Capillary Blood Glucose checks as needed.  Exercise Prescription Changes:   Exercise Prescription Changes     Row Name 08/16/23 1500 08/31/23 1400 09/16/23 1700 09/28/23 1200 10/04/23 1100     Response to Exercise   Blood Pressure (Admit) 138/70 132/70 122/68 122/64 --   Blood Pressure (Exercise) 142/82 122/70 138/68 130/58 --   Blood Pressure (Exit) 132/80 110/58 118/64 112/64 --   Heart Rate (Admit) 71 bpm 97 bpm 75 bpm 87 bpm --   Heart Rate (Exercise) 99 bpm 97 bpm 110 bpm 105 bpm --   Heart Rate (Exit) 81 bpm 82 bpm 80 bpm 84 bpm --   Oxygen Saturation (Admit) 95 % -- -- -- --   Oxygen Saturation (Exercise) 93 % -- -- -- --   Oxygen Saturation (Exit) 95 % -- -- -- --   Rating of Perceived Exertion (Exercise) 15 13 15 13  --   Perceived Dyspnea (Exercise) 1 -- 0 -- --   Symptoms none -- none  none --   Comments 6 MWT results -- -- -- --   Duration -- Continue with 30 min of aerobic exercise without signs/symptoms of physical distress. Continue with 30 min of aerobic exercise without signs/symptoms of physical distress. Continue with 30 min of aerobic exercise without signs/symptoms of physical distress. --   Intensity -- THRR unchanged THRR unchanged THRR unchanged --     Progression   Progression -- Continue to progress workloads to maintain intensity without signs/symptoms of physical  distress. Continue to progress workloads to maintain intensity without signs/symptoms of physical distress. Continue to progress workloads to maintain intensity without signs/symptoms of physical distress. --   Average METs -- 2 2.2 2.58 --     Resistance Training   Training Prescription -- Yes Yes Yes --   Weight -- 3 3 3  lb --   Reps -- 10-15 10-15 10-15 --     Interval Training   Interval Training -- No No No --     Treadmill   MPH -- -- 1.8 1.7 --   Grade -- -- 0 0 --   Minutes -- -- 15 15 --   METs -- -- 2.38 2.3 --     Recumbant Bike   Level -- -- 2 1 --   Watts -- -- 15 13 --   Minutes -- -- 15 15 --   METs -- -- 2.67 2.59 --     REL-XR   Level -- -- 2 2 --   Minutes -- -- 15 15 --   METs -- -- 3.1 4.6 --     T5 Nustep   Level -- 2 3 1  --   SPM -- 80 -- -- --   Minutes -- 15 15 15  --   METs -- 2 1.9 1.9 --     Biostep-RELP   Level -- 1 1 1  --   SPM -- 50 -- -- --   Minutes -- 15 15 15  --   METs -- 2 2 2  --     Track   Laps -- -- -- 30 --   Minutes -- -- -- 15 --   METs -- -- -- 2.63 --     Home Exercise Plan   Plans to continue exercise at -- -- -- -- Home (comment)  Walking on treadmill and outside, 3lb hand weights   Frequency -- -- -- -- Add 2 additional days to program exercise sessions.   Initial Home Exercises Provided -- -- -- -- 10/04/23     Oxygen   Maintain Oxygen Saturation -- -- 88% or higher 88% or higher 88% or higher    Row Name 10/13/23 0800 10/27/23 0800           Response to Exercise   Blood Pressure (Admit) 126/60 98/58      Blood Pressure (Exit) 114/64 102/60      Heart Rate (Admit) 86 bpm 74 bpm      Heart Rate (Exercise) 107 bpm 107 bpm      Heart Rate (Exit) 91 bpm 85 bpm      Oxygen Saturation (Admit) -- 95 %      Oxygen Saturation (Exercise) -- 95 %      Oxygen Saturation (Exit) -- 94 %      Rating of Perceived Exertion (Exercise) 13 13      Symptoms none none      Duration Continue with 30 min of aerobic exercise  without signs/symptoms of physical distress. Continue with  30 min of aerobic exercise without signs/symptoms of physical distress.      Intensity THRR unchanged THRR unchanged        Progression   Progression Continue to progress workloads to maintain intensity without signs/symptoms of physical distress. Continue to progress workloads to maintain intensity without signs/symptoms of physical distress.      Average METs 2.9 2.65        Resistance Training   Training Prescription Yes Yes      Weight 3 lb 3 lb      Reps 10-15 10-15        Interval Training   Interval Training No No        Treadmill   MPH 1.9 2      Grade 0 0      Minutes 15 15      METs 2.45 2.53        NuStep   Level -- 2      Minutes -- 15      METs -- 2.4        REL-XR   Level 2 2      Minutes 15 15      METs 4.7 3.6        Biostep-RELP   Level 2 2      Minutes 15 15      METs 2 2        Track   Laps 30 30      Minutes 15 15      METs 2.63 2.63        Home Exercise Plan   Plans to continue exercise at Home (comment)  Walking on treadmill and outside, 3lb hand weights Home (comment)  Walking on treadmill and outside, 3lb hand weights      Frequency Add 2 additional days to program exercise sessions. Add 2 additional days to program exercise sessions.      Initial Home Exercises Provided 10/04/23 10/04/23        Oxygen   Maintain Oxygen Saturation 88% or higher 88% or higher               Exercise Comments:   Exercise Comments     Row Name 08/25/23 1036 10/27/23 1116         Exercise Comments First full day of exercise!  Patient was oriented to gym and equipment including functions, settings, policies, and procedures.  Patient's individual exercise prescription and treatment plan were reviewed.  All starting workloads were established based on the results of the 6 minute walk test done at initial orientation visit.  The plan for exercise progression was also introduced and progression will  be customized based on patient's performance and goals. Rebekah Gibson graduated today from  rehab with 36 sessions completed.  Details of the patient's exercise prescription and what She needs to do in order to continue the prescription and progress were discussed with patient.  Patient was given a copy of prescription and goals.  Patient verbalized understanding. Rebekah Gibson plans to continue to exercise by walking outside and using her treadmill and handweights.               Exercise Goals and Review:   Exercise Goals     Row Name 08/16/23 1536             Exercise Goals   Increase Physical Activity Yes       Intervention Provide advice, education, support and counseling about physical activity/exercise needs.;Develop an individualized  exercise prescription for aerobic and resistive training based on initial evaluation findings, risk stratification, comorbidities and participant's personal goals.       Expected Outcomes Short Term: Attend rehab on a regular basis to increase amount of physical activity.;Long Term: Add in home exercise to make exercise part of routine and to increase amount of physical activity.;Long Term: Exercising regularly at least 3-5 days a week.       Increase Strength and Stamina Yes       Intervention Provide advice, education, support and counseling about physical activity/exercise needs.;Develop an individualized exercise prescription for aerobic and resistive training based on initial evaluation findings, risk stratification, comorbidities and participant's personal goals.       Expected Outcomes Short Term: Increase workloads from initial exercise prescription for resistance, speed, and METs.;Short Term: Perform resistance training exercises routinely during rehab and add in resistance training at home;Long Term: Improve cardiorespiratory fitness, muscular endurance and strength as measured by increased METs and functional capacity ( )       Able to understand and use  rate of perceived exertion (RPE) scale Yes       Intervention Provide education and explanation on how to use RPE scale       Expected Outcomes Short Term: Able to use RPE daily in rehab to express subjective intensity level;Long Term:  Able to use RPE to guide intensity level when exercising independently       Able to understand and use Dyspnea scale Yes       Intervention Provide education and explanation on how to use Dyspnea scale       Expected Outcomes Short Term: Able to use Dyspnea scale daily in rehab to express subjective sense of shortness of breath during exertion;Long Term: Able to use Dyspnea scale to guide intensity level when exercising independently       Knowledge and understanding of Target Heart Rate Range (THRR) Yes       Intervention Provide education and explanation of THRR including how the numbers were predicted and where they are located for reference       Expected Outcomes Short Term: Able to state/look up THRR;Long Term: Able to use THRR to govern intensity when exercising independently;Short Term: Able to use daily as guideline for intensity in rehab       Able to check pulse independently Yes       Intervention Provide education and demonstration on how to check pulse in carotid and radial arteries.;Review the importance of being able to check your own pulse for safety during independent exercise       Expected Outcomes Short Term: Able to explain why pulse checking is important during independent exercise;Long Term: Able to check pulse independently and accurately       Understanding of Exercise Prescription Yes       Intervention Provide education, explanation, and written materials on patient's individual exercise prescription       Expected Outcomes Short Term: Able to explain program exercise prescription;Long Term: Able to explain home exercise prescription to exercise independently                Exercise Goals Re-Evaluation :  Exercise Goals  Re-Evaluation     Row Name 08/25/23 1036 08/31/23 1444 09/16/23 1709 09/28/23 1214 10/04/23 1142     Exercise Goal Re-Evaluation   Exercise Goals Review Able to understand and use rate of perceived exertion (RPE) scale;Able to understand and use Dyspnea scale;Knowledge and understanding of Target Heart Rate Range (  THRR);Understanding of Exercise Prescription Increase Physical Activity;Understanding of Exercise Prescription;Increase Strength and Stamina Increase Physical Activity;Understanding of Exercise Prescription;Increase Strength and Stamina Increase Physical Activity;Understanding of Exercise Prescription;Increase Strength and Stamina Increase Physical Activity;Understanding of Exercise Prescription;Increase Strength and Stamina;Knowledge and understanding of Target Heart Rate Range (THRR);Able to understand and use rate of perceived exertion (RPE) scale;Able to understand and use Dyspnea scale;Able to check pulse independently   Comments Reviewed RPE and dyspnea scale, THR and program prescription with pt today.  Pt voiced understanding and was given a copy of goals to take home. Rebekah Gibson has completed her first session. She tolerated her current exercise prescription well with level 1 on the biostep, level 2 on the T5 nustep, and 3 lb weights. We will continue to monitor her progress in the program. Rebekah Gibson has been doing well in rehab. She was able to increase her level on the T5 nustep from level 2 to 3. She was also able to increase her speed on the treadmil from 1.5 mph to 1.93mph. We will continue to monitor her progress in the program. Rebekah Gibson is doing well in rehab. She recently increased her number of laps walked on the track up to 30 laps. She also has worked at level 1 on the biostep, recumbent bike, and T5 nustep, and level 2 on the XR. We will continue to monitor her progress in the program. Reviewed home exercise with pt today.  Pt plans to walk on her treadmill, walk outside, and use her personal  3 lb hand weights for exercise.  Reviewed THR, pulse, RPE, sign and symptoms, pulse oximetery and when to call 911 or MD.  Also discussed weather considerations and indoor options.  Pt voiced understanding.   Expected Outcomes Short: Use RPE daily to regulate intensity. Long: Follow program prescription in THR. Short: Continue to follow current exercise prescription. Long: Continue exercise to improve strength and stamina. Short: Continue to follow current exercise prescription. Long: Continue exercise to improve strength and stamina. Short: Progressively increase workloads on seated machines. Long: Continue exercise to improve strength and stamina. Short: Begin walking at home on days away from rehab. Long: Continue exercise to improve strength and stamina.    Row Name 10/13/23 4098 10/27/23 0854           Exercise Goal Re-Evaluation   Exercise Goals Review Increase Physical Activity;Increase Strength and Stamina;Understanding of Exercise Prescription Increase Physical Activity;Increase Strength and Stamina;Understanding of Exercise Prescription      Comments Rebekah Gibson is doing well in rehab. She has been able to increase her speed on the treadmill to 1. , she was also able to increase her level on the biostep to level 2. We will continue to monitor her progress in the program. Rebekah Gibson continues to do well in rehab. She is set to graduate soon. She increased her speed to on the treadmill, maintained level 2 on the XR and 30 laps on the track. We will continue to monitor her progress in the program.      Expected Outcomes Short: Continue to increase treadmill workloads. Long: Continue exercise to improve strength and stamina. Short: Continue to progressively increase treadmill and XR workloads. Long: Continue exercise to improve strength and stamina and graduate from program.               Discharge Exercise Prescription (Final Exercise Prescription Changes):  Exercise Prescription Changes -  10/27/23 0800       Response to Exercise   Blood Pressure (Admit) 98/58  Blood Pressure (Exit) 102/60    Heart Rate (Admit) 74 bpm    Heart Rate (Exercise) 107 bpm    Heart Rate (Exit) 85 bpm    Oxygen Saturation (Admit) 95 %    Oxygen Saturation (Exercise) 95 %    Oxygen Saturation (Exit) 94 %    Rating of Perceived Exertion (Exercise) 13    Symptoms none    Duration Continue with 30 min of aerobic exercise without signs/symptoms of physical distress.    Intensity THRR unchanged      Progression   Progression Continue to progress workloads to maintain intensity without signs/symptoms of physical distress.    Average METs 2.65      Resistance Training   Training Prescription Yes    Weight 3 lb    Reps 10-15      Interval Training   Interval Training No      Treadmill   MPH 2    Grade 0    Minutes 15    METs 2.53      NuStep   Level 2    Minutes 15    METs 2.4      REL-XR   Level 2    Minutes 15    METs 3.6      Biostep-RELP   Level 2    Minutes 15    METs 2      Track   Laps 30    Minutes 15    METs 2.63      Home Exercise Plan   Plans to continue exercise at Home (comment)   Walking on treadmill and outside, 3lb hand weights   Frequency Add 2 additional days to program exercise sessions.    Initial Home Exercises Provided 10/04/23      Oxygen   Maintain Oxygen Saturation 88% or higher             Nutrition:  Target Goals: Understanding of nutrition guidelines, daily intake of sodium 1500mg , cholesterol 200mg , calories 30% from fat and 7% or less from saturated fats, daily to have 5 or more servings of fruits and vegetables.  Education: All About Nutrition: -Group instruction provided by verbal, written material, interactive activities, discussions, models, and posters to present general guidelines for heart healthy nutrition including fat, fiber, MyPlate, the role of sodium in heart healthy nutrition, utilization of the nutrition label,  and utilization of this knowledge for meal planning. Follow up email sent as well. Written material given at graduation. Flowsheet Row Cardiac Rehab from 10/27/2023 in [facility] Cardiac and Pulmonary Rehab  Date 10/27/23  Educator JG part 2  Instruction Review Code 1- Verbalizes Understanding       Biometrics:  Pre Biometrics - 08/16/23 1542       Pre Biometrics   Height 5' (1.524 m)    Weight 154 lb (69.9 kg)    Waist Circumference 40 inches    Hip Circumference 44.5 inches    Waist to Hip Ratio 0.9 %    BMI (Calculated) 30.08    Single Leg Stand 4.66 seconds             Post Biometrics - 10/25/23 1123        Post  Biometrics   Height 5' (1.524 m)    Weight 156 lb 3.2 oz (70.9 kg)    Waist Circumference 39 inches    Hip Circumference 45 inches    Waist to Hip Ratio 0.87 %    BMI (Calculated) 30.51  Single Leg Stand 14.12 seconds             Nutrition Therapy Plan and Nutrition Goals:   Nutrition Assessments:  MEDIFICTS Score Key: >=70 Need to make dietary changes  40-70 Heart Healthy Diet <= 40 Therapeutic Level Cholesterol Diet  Flowsheet Row Cardiac Rehab from 10/27/2023 in [facility] Cardiac and Pulmonary Rehab  Picture Your Plate Total Score on Discharge 73      Picture Your Plate Scores: <82 Unhealthy dietary pattern with much room for improvement. 41-50 Dietary pattern unlikely to meet recommendations for good health and room for improvement. 51-60 More healthful dietary pattern, with some room for improvement.  >60 Healthy dietary pattern, although there may be some specific behaviors that could be improved.    Nutrition Goals Re-Evaluation:  Nutrition Goals Re-Evaluation     Row Name 10/04/23 1128             Goals   Comment Rebekah Gibson states that she is still working on dietary patterns she discussed with the RD. She states having too much sugar over christmas, but is doing overall limiting unhealthy foods. She states that she is continuing to  try and limit sodium intake. She reports that she is not drinking enough water but is trying to drink more.       Expected Outcome Short: Continue to work on drinking plenty of water. Long: Continue to practice heart healthy eating patterns.                Nutrition Goals Discharge (Final Nutrition Goals Re-Evaluation):  Nutrition Goals Re-Evaluation - 10/04/23 1128       Goals   Comment Rebekah Gibson states that she is still working on dietary patterns she discussed with the RD. She states having too much sugar over christmas, but is doing overall limiting unhealthy foods. She states that she is continuing to try and limit sodium intake. She reports that she is not drinking enough water but is trying to drink more.    Expected Outcome Short: Continue to work on drinking plenty of water. Long: Continue to practice heart healthy eating patterns.             Psychosocial: Target Goals: Acknowledge presence or absence of significant depression and/or stress, maximize coping skills, provide positive support system. Participant is able to verbalize types and ability to use techniques and skills needed for reducing stress and depression.   Education: Stress, Anxiety, and Depression - Group verbal and visual presentation to define topics covered.  Reviews how body is impacted by stress, anxiety, and depression.  Also discusses healthy ways to reduce stress and to treat/manage anxiety and depression.  Written material given at graduation. Flowsheet Row Cardiac Rehab from 10/27/2023 in [facility] Cardiac and Pulmonary Rehab  Date 09/22/23  Educator SB  Instruction Review Code 1- [employer] Understanding       Education: Sleep Hygiene -Provides group verbal and written instruction about how sleep can affect your health.  Define sleep hygiene, discuss sleep cycles and impact of sleep habits. Review good sleep hygiene tips.    Initial Review & Psychosocial Screening:  Initial Psych Review & Screening -  08/12/23 1017       Initial Review   Current issues with None Identified      Family Dynamics   Good Support System? Yes    Comments Rebekah Gibson has a good family support system and can look to her five children for support. She does not take any medications for her mood.  Barriers   Psychosocial barriers to participate in program The patient should benefit from training in stress management and relaxation.;There are no identifiable barriers or psychosocial needs.      Screening Interventions   Interventions To provide support and resources with identified psychosocial needs;Encouraged to exercise;Provide feedback about the scores to participant    Expected Outcomes Short Term goal: Utilizing psychosocial counselor, staff and physician to assist with identification of specific Stressors or current issues interfering with healing process. Setting desired goal for each stressor or current issue identified.;Long Term Goal: Stressors or current issues are controlled or eliminated.;Short Term goal: Identification and review with participant of any Quality of Life or Depression concerns found by scoring the questionnaire.;Long Term goal: The participant improves quality of Life and PHQ9 Scores as seen by post scores and/or verbalization of changes             Quality of Life Scores:   Quality of Life - 10/27/23 1004       Quality of Life   Select Quality of Life      Quality of Life Scores   Health/Function Post 24.75 %    Socioeconomic Post 24.25 %    Psych/Spiritual Post 27 %    Family Post 25.5 %    GLOBAL Post 25.26 %            Scores of 19 and below usually indicate a poorer quality of life in these areas.  A difference of  2-3 points is a clinically meaningful difference.  A difference of 2-3 points in the total score of the Quality of Life Index has been associated with significant improvement in overall quality of life, self-image, physical symptoms, and general health in  studies assessing change in quality of life.  PHQ-9: Review Flowsheet       10/27/2023 08/16/2023  Depression screen PHQ 2/9  Decreased Interest 0 0  Down, Depressed, Hopeless 0 0  PHQ - 2 Score 0 0  Altered sleeping 1 1  Tired, decreased energy 1 1  Change in appetite 1 0  Feeling bad or failure about yourself  0 0  Trouble concentrating 0 0  Moving slowly or fidgety/restless 0 0  Suicidal thoughts 0 0  PHQ-9 Score 3 2  Difficult doing work/chores Not difficult at all Not difficult at all   Interpretation of Total Score  Total Score Depression Severity:  1-4 = Minimal depression, 5-9 = Mild depression, 10-14 = Moderate depression, 15-19 = Moderately severe depression, 20-27 = Severe depression   Psychosocial Evaluation and Intervention:  Psychosocial Evaluation - 08/12/23 1018       Psychosocial Evaluation & Interventions   Interventions Relaxation education;Stress management education;Encouraged to exercise with the program and follow exercise prescription    Comments Rebekah Gibson has a good family support system and can look to her five children for support. She does not take any medications for her mood.    Expected Outcomes Short: Start [facility] to help with mood. Long: Maintain a healthy mental state    Continue Psychosocial Services  Follow up required by staff             Psychosocial Re-Evaluation:  Psychosocial Re-Evaluation     Row Name 10/04/23 1124             Psychosocial Re-Evaluation   Current issues with Current Stress Concerns       Comments Rebekah Gibson states that she is dealing with some vertigo which has caused some stress for her.  She states that she is doing well overall, except for her children not allowing her to drive. She states that she is sleeping well at this time. She states that her five children are a good support system for her and they help her cook and clean. She also enjoys reading, puzzles and crocheting for stress relief.       Expected  Outcomes Short: Continue to attend cardiac rehab for stress relief. Long: Continue to maintain positive outlook.       Interventions Encouraged to attend Cardiac Rehabilitation for the exercise       Continue Psychosocial Services  Follow up required by staff                Psychosocial Discharge (Final Psychosocial Re-Evaluation):  Psychosocial Re-Evaluation - 10/04/23 1124       Psychosocial Re-Evaluation   Current issues with Current Stress Concerns    Comments Rebekah Gibson states that she is dealing with some vertigo which has caused some stress for her. She states that she is doing well overall, except for her children not allowing her to drive. She states that she is sleeping well at this time. She states that her five children are a good support system for her and they help her cook and clean. She also enjoys reading, puzzles and crocheting for stress relief.    Expected Outcomes Short: Continue to attend cardiac rehab for stress relief. Long: Continue to maintain positive outlook.    Interventions Encouraged to attend Cardiac Rehabilitation for the exercise    Continue Psychosocial Services  Follow up required by staff             Vocational Rehabilitation: Provide vocational rehab assistance to qualifying candidates.   Vocational Rehab Evaluation & Intervention:   Education: Education Goals: Education classes will be provided on a variety of topics geared toward better understanding of heart health and risk factor modification. Participant will state understanding/return demonstration of topics presented as noted by education test scores.  Learning Barriers/Preferences:  Learning Barriers/Preferences - 08/12/23 1016       Learning Barriers/Preferences   Learning Barriers None    Learning Preferences None             General Cardiac Education Topics:  AED/CPR: - Group verbal and written instruction with the use of models to demonstrate the basic use of the AED  with the basic ABC's of resuscitation.   Anatomy and Cardiac Procedures: - Group verbal and visual presentation and models provide information about basic cardiac anatomy and function. Reviews the testing methods done to diagnose heart disease and the outcomes of the test results. Describes the treatment choices: Medical Management, Angioplasty, or Coronary Bypass Surgery for treating various heart conditions including Myocardial Infarction, Angina, Valve Disease, and Cardiac Arrhythmias.  Written material given at graduation. Flowsheet Row Cardiac Rehab from 10/27/2023 in [facility] Cardiac and Pulmonary Rehab  Education need identified 08/16/23  Date 08/25/23  Educator SB  Instruction Review Code 1- Verbalizes Understanding       Medication Safety: - Group verbal and visual instruction to review commonly prescribed medications for heart and lung disease. Reviews the medication, class of the drug, and side effects. Includes the steps to properly store meds and maintain the prescription regimen.  Written material given at graduation. Flowsheet Row Cardiac Rehab from 10/27/2023 in [facility] Cardiac and Pulmonary Rehab  Date 09/01/23  Educator SB  Instruction Review Code 1- Verbalizes Understanding       Intimacy: - Group verbal  instruction through game format to discuss how heart and lung disease can affect sexual intimacy. Written material given at graduation.. Flowsheet Row Cardiac Rehab from 10/27/2023 in [facility] Cardiac and Pulmonary Rehab  Date 10/13/23  Educator NT  Instruction Review Code 1- Verbalizes Understanding       Know Your Numbers and Heart Failure: - Group verbal and visual instruction to discuss disease risk factors for cardiac and pulmonary disease and treatment options.  Reviews associated critical values for Overweight/Obesity, Hypertension, Cholesterol, and Diabetes.  Discusses basics of heart failure: signs/symptoms and treatments.  Introduces Heart Failure Zone chart for  action plan for heart failure.  Written material given at graduation. Flowsheet Row Cardiac Rehab from 10/27/2023 in [facility] Cardiac and Pulmonary Rehab  Date 09/08/23  Educator SB  Instruction Review Code 1- Verbalizes Understanding       Infection Prevention: - Provides verbal and written material to individual with discussion of infection control including proper hand washing and proper equipment cleaning during exercise session. Flowsheet Row Cardiac Rehab from 10/27/2023 in [facility] Cardiac and Pulmonary Rehab  Date 08/16/23  Educator [facility]  Instruction Review Code 1- Verbalizes Understanding       Falls Prevention: - Provides verbal and written material to individual with discussion of falls prevention and safety. Flowsheet Row Cardiac Rehab from 10/27/2023 in [facility] Cardiac and Pulmonary Rehab  Date 08/16/23  Educator [facility]  Instruction Review Code 1- Verbalizes Understanding       Other: -Provides group and verbal instruction on various topics (see comments)   Knowledge Questionnaire Score:  Knowledge Questionnaire Score - 10/27/23 1004       Knowledge Questionnaire Score   Pre Score 26/26             Core Components/Risk Factors/Patient Goals at Admission:  Personal Goals and Risk Factors at Admission - 08/16/23 1546       Core Components/Risk Factors/Patient Goals on Admission    Weight Management Yes;Weight Loss    Intervention Weight Management: Develop a combined nutrition and exercise program designed to reach desired caloric intake, while maintaining appropriate intake of nutrient and fiber, sodium and fats, and appropriate energy expenditure required for the weight goal.;Weight Management: Provide education and appropriate resources to help participant work on and attain dietary goals.;Weight Management/Obesity: Establish reasonable short term and long term weight goals.    Admit Weight 154 lb (69.9 kg)    Goal Weight: Short Term 148 lb (67.1 kg)    Goal Weight:  Long Term 140 lb (63.5 kg)    Expected Outcomes Short Term: Continue to assess and modify interventions until short term weight is achieved;Long Term: Adherence to nutrition and physical activity/exercise program aimed toward attainment of established weight goal;Weight Loss: Understanding of general recommendations for a balanced deficit meal plan, which promotes 1-2 lb weight loss per week and includes a negative energy balance of [phone] kcal/d;Understanding recommendations for meals to include 15-35% energy as protein, 25-35% energy from fat, 35-60% energy from carbohydrates, less than 200mg  of dietary cholesterol, 20-35 gm of total fiber daily;Understanding of distribution of calorie intake throughout the day with the consumption of 4-5 meals/snacks    Diabetes Yes    Intervention Provide education about signs/symptoms and action to take for hypo/hyperglycemia.;Provide education about proper nutrition, including hydration, and aerobic/resistive exercise prescription along with prescribed medications to achieve blood glucose in normal ranges: Fasting glucose 65-99 mg/dL    Expected Outcomes Short Term: Participant verbalizes understanding of the signs/symptoms and immediate care of hyper/hypoglycemia, proper  foot care and importance of medication, aerobic/resistive exercise and nutrition plan for blood glucose control.;Long Term: Attainment of HbA1C < 7%.    Heart Failure Yes    Intervention Provide a combined exercise and nutrition program that is supplemented with education, support and counseling about heart failure. Directed toward relieving symptoms such as shortness of breath, decreased exercise tolerance, and extremity edema.    Expected Outcomes Improve functional capacity of life;Short term: Attendance in program 2-3 days a week with increased exercise capacity. Reported lower sodium intake. Reported increased fruit and vegetable intake. Reports medication compliance.;Short term: Daily weights  obtained and reported for increase. Utilizing diuretic protocols set by physician.;Long term: Adoption of self-care skills and reduction of barriers for early signs and symptoms recognition and intervention leading to self-care maintenance.    Hypertension Yes    Intervention Provide education on lifestyle modifcations including regular physical activity/exercise, weight management, moderate sodium restriction and increased consumption of fresh fruit, vegetables, and low fat dairy, alcohol moderation, and smoking cessation.;Monitor prescription use compliance.    Expected Outcomes Short Term: Continued assessment and intervention until BP is < 140/25mm HG in hypertensive participants. < 130/32mm HG in hypertensive participants with diabetes, heart failure or chronic kidney disease.;Long Term: Maintenance of blood pressure at goal levels.    Lipids Yes    Intervention Provide education and support for participant on nutrition & aerobic/resistive exercise along with prescribed medications to achieve LDL 70mg , HDL >40mg .    Expected Outcomes Short Term: Participant states understanding of desired cholesterol values and is compliant with medications prescribed. Participant is following exercise prescription and nutrition guidelines.;Long Term: Cholesterol controlled with medications as prescribed, with individualized exercise RX and with personalized nutrition plan. Value goals: LDL < 70mg , HDL > 40 mg.             Education:Diabetes - Individual verbal and written instruction to review signs/symptoms of diabetes, desired ranges of glucose level fasting, after meals and with exercise. Acknowledge that pre and post exercise glucose checks will be done for 3 sessions at entry of program. Flowsheet Row Cardiac Rehab from 10/27/2023 in [facility] Cardiac and Pulmonary Rehab  Date 08/16/23  Educator [facility]  Instruction Review Code 1- Verbalizes Understanding       Core Components/Risk Factors/Patient Goals  Review:   Goals and Risk Factor Review     Row Name 10/04/23 1130             Core Components/Risk Factors/Patient Goals Review   Personal Goals Review Diabetes;Hypertension;Weight Management/Obesity       Review Rebekah Gibson states that her weight has been creeping up some. She wants to work on losing some weight as she weighed in today at 156 lb and has a weight goal of 120 lbs.She is checking her BP at home every morning, and reports that it has stayed within normal ranges. Her sugar has been well controlled as well, as she checks it every morning also.       Expected Outcomes Short: Continue to work towards weight goal through diet and exercise. Long: Continue to manage lifestyle risk factors.                Core Components/Risk Factors/Patient Goals at Discharge (Final Review):   Goals and Risk Factor Review - 10/04/23 1130       Core Components/Risk Factors/Patient Goals Review   Personal Goals Review Diabetes;Hypertension;Weight Management/Obesity    Review Rebekah Gibson states that her weight has been creeping up some. She wants to work on  losing some weight as she weighed in today at 156 lb and has a weight goal of 120 lbs.She is checking her BP at home every morning, and reports that it has stayed within normal ranges. Her sugar has been well controlled as well, as she checks it every morning also.    Expected Outcomes Short: Continue to work towards weight goal through diet and exercise. Long: Continue to manage lifestyle risk factors.             ITP Comments:  ITP Comments     Row Name 08/12/23 1015 08/16/23 1525 08/25/23 1035 08/25/23 1124 09/22/23 0940   ITP Comments Virtual Visit completed. Patient informed on EP and RD appointment and 6 Minute walk test. Patient also informed of patient health questionnaires on My Chart. Patient Verbalizes understanding. Visit diagnosis can [facility] found in [facility]. Completed and gym orientation. Initial ITP created and sent for review  to Dr. Bethann Punches, Medical Director. First full day of exercise!  Patient was oriented to gym and equipment including functions, settings, policies, and procedures.  Patient's individual exercise prescription and treatment plan were reviewed.  All starting workloads were established based on the results of the 6 minute walk test done at initial orientation visit.  The plan for exercise progression was also introduced and progression will be customized based on patient's performance and goals. 30 Day review completed. Medical Director ITP review done, changes made as directed, and signed approval by Medical Director.    new to program 30 Day review completed. Medical Director ITP review done, changes made as directed, and signed approval by Medical Director.    Row Name 10/20/23 1247 10/27/23 1116         ITP Comments 30 Day review completed. Medical Director ITP review done, changes made as directed, and signed approval by Medical Director. Rebekah Gibson graduated today from  rehab with 36 sessions completed.  Details of the patient's exercise prescription and what She needs to do in order to continue the prescription and progress were discussed with patient.  Patient was given a copy of prescription and goals.  Patient verbalized understanding. Rebekah Gibson plans to continue to exercise by walking outside and using her treadmill and handweights.               Comments: DISCHARGE ITP

## 2023-10-27 NOTE — Progress Notes (Signed)
Discharge Note for  Rebekah Gibson     1945-12-31        [name] graduated today from  rehab with 36 sessions completed.  Details of the patient's exercise prescription and what She needs to do in order to continue the prescription and progress were discussed with patient.  Patient was given a copy of prescription and goals.  Patient verbalized understanding. [name] plans to continue to exercise by walking outside and using her treadmill and handweights.   6 Minute Walk     Row Name 08/16/23 1526 10/25/23 1122       6 Minute Walk   Phase Initial Discharge    Distance 1150 feet 1185 feet    Distance % Change -- 3 %    Distance Feet Change -- 35 ft    Walk Time 6 minutes 6 minutes    # of Rest Breaks 0 0    MPH 2.18 2.24    METS 2.12 2.19    RPE 15 11    Perceived Dyspnea  1 1    VO2 Peak 7.42 7.65    Symptoms No No    Resting HR 71 bpm 75 bpm    Resting BP 138/70 102/70    Resting Oxygen Saturation  95 % 95 %    Exercise Oxygen Saturation  during 6 min walk 93 % 95 %    Max Ex. HR 99 bpm 103 bpm    Max Ex. BP 142/82 140/70    2 Minute Post BP 132/80 110/62

## 2023-10-27 NOTE — Progress Notes (Signed)
Daily Session Note  Patient Details  Name: Rebekah Gibson MRN: [id number] Date of Birth: October 14, 1945 Referring Provider:   Flowsheet Row Cardiac Rehab from 08/16/2023 in [facility] Cardiac and Pulmonary Rehab  Referring Provider Dr. Trixie Rude       Encounter Date: 10/27/2023  Check In:  Session Check In - 10/27/23 1114       Check-In   Supervising physician immediately available to respond to emergencies See telemetry face sheet for immediately available ER MD    Location [facility]-Cardiac & Pulmonary Rehab    Staff Present Cora Collum, RN, BSN, CCRP;Noah Tickle, BS, Exercise Physiologist;Meredith Jewel Baize RN,BSN;Maxon Conetta BS, Exercise Physiologist;Jason Wallace Cullens RDN,LDN    Virtual Visit No    Medication changes reported     No    Fall or balance concerns reported    No    Warm-up and Cool-down Performed on first and last piece of equipment    Resistance Training Performed Yes    VAD Patient? No    PAD/SET Patient? No      Pain Assessment   Currently in Pain? No/denies                Social History   Tobacco Use  Smoking Status Never  Smokeless Tobacco Never    Goals Met:  Independence with exercise equipment Exercise tolerated well Personal goals reviewed No report of concerns or symptoms today  Goals Unmet:  Not Applicable  Comments:  Rebekah Gibson graduated today from  rehab with 36 sessions completed.  Details of the patient's exercise prescription and what She needs to do in order to continue the prescription and progress were discussed with patient.  Patient was given a copy of prescription and goals.  Patient verbalized understanding. Rebekah Gibson plans to continue to exercise by walking outside and using her treadmill and handweights.    Dr. Bethann Punches is Medical Director for [facility] Cardiac Rehabilitation.  Dr. Vida Rigger is Medical Director for [facility] Pulmonary Rehabilitation.

## 2023-11-04 DIAGNOSIS — H401230 Low-tension glaucoma, bilateral, stage unspecified: Secondary | ICD-10-CM | POA: Diagnosis not present

## 2023-11-04 DIAGNOSIS — Z7984 Long term (current) use of oral hypoglycemic drugs: Secondary | ICD-10-CM | POA: Diagnosis not present

## 2023-11-04 DIAGNOSIS — E119 Type 2 diabetes mellitus without complications: Secondary | ICD-10-CM | POA: Diagnosis not present

## 2023-12-01 DIAGNOSIS — H401230 Low-tension glaucoma, bilateral, stage unspecified: Secondary | ICD-10-CM | POA: Diagnosis not present

## 2023-12-01 DIAGNOSIS — Z7984 Long term (current) use of oral hypoglycemic drugs: Secondary | ICD-10-CM | POA: Diagnosis not present

## 2023-12-01 DIAGNOSIS — E119 Type 2 diabetes mellitus without complications: Secondary | ICD-10-CM | POA: Diagnosis not present

## 2024-01-18 DIAGNOSIS — E119 Type 2 diabetes mellitus without complications: Secondary | ICD-10-CM | POA: Diagnosis not present

## 2024-01-18 DIAGNOSIS — H401230 Low-tension glaucoma, bilateral, stage unspecified: Secondary | ICD-10-CM | POA: Diagnosis not present

## 2024-01-18 DIAGNOSIS — Z7984 Long term (current) use of oral hypoglycemic drugs: Secondary | ICD-10-CM | POA: Diagnosis not present

## 2024-02-10 DIAGNOSIS — E1159 Type 2 diabetes mellitus with other circulatory complications: Secondary | ICD-10-CM | POA: Diagnosis not present

## 2024-02-10 DIAGNOSIS — E7800 Pure hypercholesterolemia, unspecified: Secondary | ICD-10-CM | POA: Diagnosis not present

## 2024-02-10 DIAGNOSIS — Z79899 Other long term (current) drug therapy: Secondary | ICD-10-CM | POA: Diagnosis not present

## 2024-02-10 DIAGNOSIS — I152 Hypertension secondary to endocrine disorders: Secondary | ICD-10-CM | POA: Diagnosis not present

## 2024-02-17 DIAGNOSIS — I5020 Unspecified systolic (congestive) heart failure: Secondary | ICD-10-CM | POA: Diagnosis not present

## 2024-02-17 DIAGNOSIS — E7800 Pure hypercholesterolemia, unspecified: Secondary | ICD-10-CM | POA: Diagnosis not present

## 2024-02-17 DIAGNOSIS — Z79899 Other long term (current) drug therapy: Secondary | ICD-10-CM | POA: Diagnosis not present

## 2024-02-17 DIAGNOSIS — Z1331 Encounter for screening for depression: Secondary | ICD-10-CM | POA: Diagnosis not present

## 2024-02-17 DIAGNOSIS — Z0000 Encounter for general adult medical examination without abnormal findings: Secondary | ICD-10-CM | POA: Diagnosis not present

## 2024-02-17 DIAGNOSIS — E1159 Type 2 diabetes mellitus with other circulatory complications: Secondary | ICD-10-CM | POA: Diagnosis not present

## 2024-02-17 DIAGNOSIS — I152 Hypertension secondary to endocrine disorders: Secondary | ICD-10-CM | POA: Diagnosis not present

## 2024-02-17 DIAGNOSIS — I25118 Atherosclerotic heart disease of native coronary artery with other forms of angina pectoris: Secondary | ICD-10-CM | POA: Diagnosis not present

## 2024-05-18 DIAGNOSIS — Z7984 Long term (current) use of oral hypoglycemic drugs: Secondary | ICD-10-CM | POA: Diagnosis not present

## 2024-05-18 DIAGNOSIS — H401230 Low-tension glaucoma, bilateral, stage unspecified: Secondary | ICD-10-CM | POA: Diagnosis not present

## 2024-05-18 DIAGNOSIS — E119 Type 2 diabetes mellitus without complications: Secondary | ICD-10-CM | POA: Diagnosis not present

## 2024-05-18 DIAGNOSIS — H2513 Age-related nuclear cataract, bilateral: Secondary | ICD-10-CM | POA: Diagnosis not present

## 2024-08-14 DIAGNOSIS — Z79899 Other long term (current) drug therapy: Secondary | ICD-10-CM | POA: Diagnosis not present

## 2024-08-14 DIAGNOSIS — I152 Hypertension secondary to endocrine disorders: Secondary | ICD-10-CM | POA: Diagnosis not present

## 2024-08-14 DIAGNOSIS — E7800 Pure hypercholesterolemia, unspecified: Secondary | ICD-10-CM | POA: Diagnosis not present

## 2024-08-14 DIAGNOSIS — E1159 Type 2 diabetes mellitus with other circulatory complications: Secondary | ICD-10-CM | POA: Diagnosis not present

## 2024-08-21 DIAGNOSIS — I25118 Atherosclerotic heart disease of native coronary artery with other forms of angina pectoris: Secondary | ICD-10-CM | POA: Diagnosis not present

## 2024-08-21 DIAGNOSIS — E7800 Pure hypercholesterolemia, unspecified: Secondary | ICD-10-CM | POA: Diagnosis not present

## 2024-08-21 DIAGNOSIS — I152 Hypertension secondary to endocrine disorders: Secondary | ICD-10-CM | POA: Diagnosis not present

## 2024-08-21 DIAGNOSIS — Z79899 Other long term (current) drug therapy: Secondary | ICD-10-CM | POA: Diagnosis not present

## 2024-08-21 DIAGNOSIS — I5020 Unspecified systolic (congestive) heart failure: Secondary | ICD-10-CM | POA: Diagnosis not present

## 2024-08-21 DIAGNOSIS — E1159 Type 2 diabetes mellitus with other circulatory complications: Secondary | ICD-10-CM | POA: Diagnosis not present

## 2024-08-23 DIAGNOSIS — I10 Essential (primary) hypertension: Secondary | ICD-10-CM | POA: Diagnosis not present

## 2024-08-23 DIAGNOSIS — I2510 Atherosclerotic heart disease of native coronary artery without angina pectoris: Secondary | ICD-10-CM | POA: Diagnosis not present
